# Patient Record
Sex: Female | Born: 1975 | Race: Black or African American | Hispanic: No | Marital: Single | Smoking: Current every day smoker
Health system: Southern US, Community
[De-identification: ages and names within clinical notes are randomized; demographics above are authoritative.]

## PROBLEM LIST (undated history)

## (undated) DIAGNOSIS — F32A Depression, unspecified: Secondary | ICD-10-CM

## (undated) DIAGNOSIS — F419 Anxiety disorder, unspecified: Secondary | ICD-10-CM

## (undated) DIAGNOSIS — F329 Major depressive disorder, single episode, unspecified: Secondary | ICD-10-CM

## (undated) HISTORY — DX: Depression, unspecified: F32.A

## (undated) HISTORY — DX: Anxiety disorder, unspecified: F41.9

## (undated) HISTORY — DX: Major depressive disorder, single episode, unspecified: F32.9

## (undated) HISTORY — PX: TUBAL LIGATION: SHX77

---

## 1998-09-01 ENCOUNTER — Emergency Department (HOSPITAL_COMMUNITY): Admission: EM | Admit: 1998-09-01 | Discharge: 1998-09-01 | Payer: Self-pay | Admitting: Emergency Medicine

## 1999-07-17 ENCOUNTER — Encounter: Payer: Self-pay | Admitting: *Deleted

## 1999-07-17 ENCOUNTER — Emergency Department (HOSPITAL_COMMUNITY): Admission: EM | Admit: 1999-07-17 | Discharge: 1999-07-17 | Payer: Self-pay | Admitting: Emergency Medicine

## 2002-01-26 ENCOUNTER — Encounter: Payer: Self-pay | Admitting: *Deleted

## 2002-01-26 ENCOUNTER — Emergency Department (HOSPITAL_COMMUNITY): Admission: EM | Admit: 2002-01-26 | Discharge: 2002-01-26 | Payer: Self-pay | Admitting: *Deleted

## 2008-03-22 ENCOUNTER — Emergency Department (HOSPITAL_COMMUNITY): Admission: EM | Admit: 2008-03-22 | Discharge: 2008-03-22 | Payer: Self-pay | Admitting: Family Medicine

## 2008-11-26 ENCOUNTER — Emergency Department (HOSPITAL_COMMUNITY): Admission: EM | Admit: 2008-11-26 | Discharge: 2008-11-26 | Payer: Self-pay | Admitting: Family Medicine

## 2010-06-01 LAB — POCT URINALYSIS DIP (DEVICE)
Bilirubin Urine: NEGATIVE
Glucose, UA: NEGATIVE mg/dL
Hgb urine dipstick: NEGATIVE
Ketones, ur: NEGATIVE mg/dL
Nitrite: NEGATIVE
Protein, ur: NEGATIVE mg/dL
Specific Gravity, Urine: >=1.03 (ref 1.005–1.030)
Urobilinogen, UA: 1 mg/dL (ref 0.0–1.0)
pH: 6 (ref 5.0–8.0)

## 2010-06-01 LAB — GC/CHLAMYDIA PROBE AMP, GENITAL
Chlamydia, DNA Probe: NEGATIVE
GC Probe Amp, Genital: NEGATIVE

## 2010-06-01 LAB — WET PREP, GENITAL
Trich, Wet Prep: NONE SEEN
WBC, Wet Prep HPF POC: NONE SEEN
Yeast Wet Prep HPF POC: NONE SEEN

## 2010-06-01 LAB — POCT PREGNANCY, URINE: Preg Test, Ur: NEGATIVE

## 2010-11-13 ENCOUNTER — Emergency Department (HOSPITAL_COMMUNITY)
Admission: EM | Admit: 2010-11-13 | Discharge: 2010-11-13 | Disposition: A | Discharge status: Home / Self Care | Payer: Self-pay | Attending: Emergency Medicine | Admitting: Emergency Medicine

## 2010-11-13 DIAGNOSIS — R220 Localized swelling, mass and lump, head: Secondary | ICD-10-CM | POA: Insufficient documentation

## 2010-11-13 DIAGNOSIS — K089 Disorder of teeth and supporting structures, unspecified: Secondary | ICD-10-CM | POA: Insufficient documentation

## 2010-11-13 DIAGNOSIS — K029 Dental caries, unspecified: Secondary | ICD-10-CM | POA: Insufficient documentation

## 2010-11-13 DIAGNOSIS — R221 Localized swelling, mass and lump, neck: Secondary | ICD-10-CM | POA: Insufficient documentation

## 2015-01-28 ENCOUNTER — Emergency Department (INDEPENDENT_AMBULATORY_CARE_PROVIDER_SITE_OTHER)
Admission: EM | Admit: 2015-01-28 | Discharge: 2015-01-28 | Disposition: A | Discharge status: Home / Self Care | Payer: 59 | Source: Home / Self Care | Attending: Family Medicine | Admitting: Family Medicine

## 2015-01-28 ENCOUNTER — Encounter (HOSPITAL_COMMUNITY): Payer: Self-pay | Admitting: Emergency Medicine

## 2015-01-28 ENCOUNTER — Other Ambulatory Visit (HOSPITAL_COMMUNITY)
Admission: RE | Admit: 2015-01-28 | Discharge: 2015-01-28 | Disposition: A | Discharge status: Home / Self Care | Payer: 59 | Source: Ambulatory Visit | Attending: Family Medicine | Admitting: Family Medicine

## 2015-01-28 DIAGNOSIS — R1084 Generalized abdominal pain: Secondary | ICD-10-CM

## 2015-01-28 DIAGNOSIS — K5901 Slow transit constipation: Secondary | ICD-10-CM

## 2015-01-28 DIAGNOSIS — N730 Acute parametritis and pelvic cellulitis: Secondary | ICD-10-CM

## 2015-01-28 DIAGNOSIS — Z113 Encounter for screening for infections with a predominantly sexual mode of transmission: Secondary | ICD-10-CM | POA: Insufficient documentation

## 2015-01-28 DIAGNOSIS — N760 Acute vaginitis: Secondary | ICD-10-CM | POA: Insufficient documentation

## 2015-01-28 DIAGNOSIS — R102 Pelvic and perineal pain unspecified side: Secondary | ICD-10-CM

## 2015-01-28 LAB — POCT URINALYSIS DIP (DEVICE)
BILIRUBIN URINE: NEGATIVE
Glucose, UA: NEGATIVE mg/dL
HGB URINE DIPSTICK: NEGATIVE
KETONES UR: NEGATIVE mg/dL
Leukocytes, UA: NEGATIVE
Nitrite: NEGATIVE
PH: 7 (ref 5.0–8.0)
PROTEIN: NEGATIVE mg/dL
Specific Gravity, Urine: 1.025 (ref 1.005–1.030)
Urobilinogen, UA: 1 mg/dL (ref 0.0–1.0)

## 2015-01-28 LAB — POCT PREGNANCY, URINE: Preg Test, Ur: NEGATIVE

## 2015-01-28 MED ORDER — CEFTRIAXONE SODIUM 250 MG IJ SOLR
250.0000 mg | Freq: Once | INTRAMUSCULAR | Status: AC
  Administered 2015-01-28: 250 mg via INTRAMUSCULAR

## 2015-01-28 MED ORDER — AZITHROMYCIN 250 MG PO TABS
ORAL_TABLET | ORAL | Status: AC
  Filled 2015-01-28: qty 4

## 2015-01-28 MED ORDER — CEFTRIAXONE SODIUM 250 MG IJ SOLR
INTRAMUSCULAR | Status: AC
  Filled 2015-01-28: qty 250

## 2015-01-28 MED ORDER — LIDOCAINE HCL (PF) 1 % IJ SOLN
INTRAMUSCULAR | Status: AC
  Filled 2015-01-28: qty 5

## 2015-01-28 MED ORDER — METRONIDAZOLE 500 MG PO TABS: 500.0000 mg | ORAL_TABLET | Freq: Two times a day (BID) | ORAL | Status: DC

## 2015-01-28 MED ORDER — AZITHROMYCIN 250 MG PO TABS
1000.0000 mg | ORAL_TABLET | Freq: Once | ORAL | Status: AC
  Administered 2015-01-28: 1000 mg via ORAL

## 2015-01-28 NOTE — ED Provider Notes (Signed)
CSN: [id number]     Arrival date & time 01/28/15  1314 History   First MD Initiated Contact with Patient 01/28/15 1448     Chief Complaint  Patient presents with  . Abdominal Pain   (Consider location/radiation/quality/duration/timing/severity/associated sxs/prior Treatment) HPI Comments: 39 year old female complaining of "stomach pain" for a few months. No more specific in terms of length of time of symptoms. She states it was worse yesterday when she had a cramp-like feeling in the right lateral abdomen. Sometimes she has pain that occurs in the epigastrium, across the lower abdomen and in the pelvis. It comes and goes. She describes pain starts from her "butt to her genitals". Denies urinary symptoms. She states she does have a vaginal discharge started yesterday. Her LMP was 01/11/2015. She describes stools in the past day or 2 that had been soft and loose but does not describe diarrhea. She states she does not have bowel movements on a regular basis.   History reviewed. No pertinent past medical history. History reviewed. No pertinent past surgical history. No family history on file. Social History  Substance Use Topics  . Smoking status: Current Every Day Smoker -- 1.00 packs/day    Types: Cigarettes  . Smokeless tobacco: None  . Alcohol Use: Yes   OB History    No data available     Review of Systems  Constitutional: Positive for activity change. Negative for fever and fatigue.  HENT: Negative.   Respiratory: Negative.   Cardiovascular: Negative.   Gastrointestinal: Positive for abdominal pain. Negative for vomiting and blood in stool.  Genitourinary: Positive for vaginal discharge and pelvic pain. Negative for dysuria, urgency, frequency and vaginal bleeding.  Musculoskeletal: Negative.   Skin: Negative.   Neurological: Negative.     Allergies  Review of patient's allergies indicates no known allergies.  Home Medications   Prior to Admission medications    Medication Sig Start Date End Date Taking? Authorizing Provider  metroNIDAZOLE (FLAGYL) 500 MG tablet Take 1 tablet (500 mg total) by mouth 2 (two) times daily. X 7 days 01/28/15   Hayden Rasmussenavid Knute Mazzuca, NP   Meds Ordered and Administered this Visit   Medications  cefTRIAXone (ROCEPHIN) injection 250 mg (not administered)  azithromycin (ZITHROMAX) tablet 1,000 mg (not administered)    BP 126/76 mmHg  Pulse 62  Temp(Src) 99.4 F (37.4 C) (Oral)  Resp 18  SpO2 100%  LMP 01/11/2015 No data found.   Physical Exam  Constitutional: She is oriented to person, place, and time. She appears well-developed and well-nourished. No distress.  Eyes: EOM are normal.  Neck: Normal range of motion. Neck supple.  Cardiovascular: Normal rate, regular rhythm and normal heart sounds.   Pulmonary/Chest: Effort normal. No respiratory distress. She has no wheezes. She has no rales.  Abdominal: Soft. Bowel sounds are normal. She exhibits no mass. There is tenderness. There is no rebound and no guarding.  Mild tenderness to the epigastrium, right lower and mid quadrant and periumbilical area. Much of the abdomen percusses tympanic. Percussion is dull in the lower abdomen. Tenderness to the anterior pelvis.  Genitourinary:  Normal normal external female genitalia Copious amount of thick white vaginal discharge coating the vaginal walls and pulled in the vaginal vault. Cervix is far posterior. Ectocervix with one above the N cyst. Otherwise clear. Bimanual: Moderate to severe CMT and bilateral adnexal tenderness.   Musculoskeletal: She exhibits no edema.  Neurological: She is alert and oriented to person, place, and time. She exhibits normal muscle tone.  Skin: Skin is warm and dry.  Psychiatric: She has a normal mood and affect.  Nursing note and vitals reviewed.   ED Course  Procedures (including critical care time)  Labs Review Labs Reviewed  POCT URINALYSIS DIP (DEVICE)  POCT PREGNANCY, URINE   CERVICOVAGINAL ANCILLARY ONLY   Results for orders placed or performed during the hospital encounter of 01/28/15  POCT urinalysis dip (device)  Result Value Ref Range   Glucose, UA NEGATIVE NEGATIVE mg/dL   Bilirubin Urine NEGATIVE NEGATIVE   Ketones, ur NEGATIVE NEGATIVE mg/dL   Specific Gravity, Urine 1.025 1.005 - 1.030   Hgb urine dipstick NEGATIVE NEGATIVE   pH 7.0 5.0 - 8.0   Protein, ur NEGATIVE NEGATIVE mg/dL   Urobilinogen, UA 1.0 0.0 - 1.0 mg/dL   Nitrite NEGATIVE NEGATIVE   Leukocytes, UA NEGATIVE NEGATIVE  Pregnancy, urine POC  Result Value Ref Range   Preg Test, Ur NEGATIVE NEGATIVE     Imaging Review No results found.   Visual Acuity Review  Right Eye Distance:   Left Eye Distance:   Bilateral Distance:    Right Eye Near:   Left Eye Near:    Bilateral Near:         MDM   1. Generalized abdominal pain   2. PID (acute pelvic inflammatory disease)   3. Pelvic pain in female   4. Slow transit constipation    likely also has bacterial vaginosis. Malodorous discharge. Rocephin 250 mg IM now Azithromycin 1 g by mouth now Flagyl 500 mg twice a day per Rx Cervical cytology pending.    Hayden Rasmussen, NP 01/28/15 [phone]

## 2015-01-28 NOTE — Discharge Instructions (Signed)
Abdominal Pain, Adult Many things can cause abdominal pain. Usually, abdominal pain is not caused by a disease and will improve without treatment. It can often be observed and treated at home. Your health care provider will do a physical exam and possibly order blood tests and X-rays to help determine the seriousness of your pain. However, in many cases, more time must pass before a clear cause of the pain can be found. Before that point, your health care provider may not know if you need more testing or further treatment. HOME CARE INSTRUCTIONS Monitor your abdominal pain for any changes. The following actions may help to alleviate any discomfort you are experiencing:  Only take over-the-counter or prescription medicines as directed by your health care provider.  Do not take laxatives unless directed to do so by your health care provider.  Try a clear liquid diet (broth, tea, or water) as directed by your health care provider. Slowly move to a bland diet as tolerated. SEEK MEDICAL CARE IF:  You have unexplained abdominal pain.  You have abdominal pain associated with nausea or diarrhea.  You have pain when you urinate or have a bowel movement.  You experience abdominal pain that wakes you in the night.  You have abdominal pain that is worsened or improved by eating food.  You have abdominal pain that is worsened with eating fatty foods.  You have a fever. SEEK IMMEDIATE MEDICAL CARE IF:  Your pain does not go away within 2 hours.  You keep throwing up (vomiting).  Your pain is felt only in portions of the abdomen, such as the right side or the left lower portion of the abdomen.  You pass bloody or black tarry stools. MAKE SURE YOU:  Understand these instructions.  Will watch your condition.  Will get help right away if you are not doing well or get worse.   This information is not intended to replace advice given to you by your health care provider. Make sure you discuss  any questions you have with your health care provider.   Document Released: 11/22/2004 Document Revised: 11/03/2014 Document Reviewed: 10/22/2012 Elsevier Interactive Patient Education 2016 [employer].  Constipation, Adult MiraLAX as directed. It started out using 1 Full for 6-8 ounces of liquid and repeat in 2-3 hours. If results are not adequate may repeat the following day. Constipation is when a person:  Poops (has a bowel movement) less than 3 times a week.  Has a hard time pooping.  Has poop that is dry, hard, or bigger than normal. HOME CARE   Eat foods with a lot of fiber in them. This includes fruits, vegetables, beans, and whole grains such as brown rice.  Avoid fatty foods and foods with a lot of sugar. This includes french fries, hamburgers, cookies, candy, and soda.  If you are not getting enough fiber from food, take products with added fiber in them (supplements).  Drink enough fluid to keep your pee (urine) clear or pale yellow.  Exercise on a regular basis, or as told by your doctor.  Go to the restroom when you feel like you need to poop. Do not hold it.  Only take medicine as told by your doctor. Do not take medicines that help you poop (laxatives) without talking to your doctor first. GET HELP RIGHT AWAY IF:   You have bright red blood in your poop (stool).  Your constipation lasts more than 4 days or gets worse.  You have belly (abdominal) or butt (  rectal) pain.  You have thin poop (as thin as a pencil).  You lose weight, and it cannot be explained. MAKE SURE YOU:   Understand these instructions.  Will watch your condition.  Will get help right away if you are not doing well or get worse.   This information is not intended to replace advice given to you by your health care provider. Make sure you discuss any questions you have with your health care provider.   Document Released: 08/01/2007 Document Revised: 03/05/2014 Document Reviewed:  11/24/2012 Elsevier Interactive Patient Education 2016 [employer] Inc[employer]  Pelvic Inflammatory Disease Pelvic inflammatory disease (PID) refers to an infection in some or all of the female organs. The infection can be in the uterus, ovaries, fallopian tubes, or the surrounding tissues in the pelvis. PID can cause abdominal or pelvic pain that comes on suddenly (acute pelvic pain). PID is a serious infection because it can lead to lasting (chronic) pelvic pain or the inability to have children (infertility). CAUSES This condition is most often caused by an infection that is spread during sexual contact. However, the infection can also be caused by the normal bacteria that are found in the vaginal tissues if these bacteria travel upward into the reproductive organs. PID can also occur following:  The birth of a baby.  A miscarriage.  An abortion.  Major pelvic surgery.  The use of an intrauterine device (IUD).  A sexual assault. RISK FACTORS This condition is more likely to develop in women who:  Are younger than 41 years of age.  Are sexually active at [facility] age.  Use nonbarrier contraception.  Have multiple sexual partners.  Have sex with someone who has symptoms of an STD (sexually transmitted disease).  Use oral contraception. At times, certain behaviors can also increase the possibility of getting PID, such as:  Using a vaginal douche.  Having an IUD in place. SYMPTOMS Symptoms of this condition include:  Abdominal or pelvic pain.  Fever.  Chills.  Abnormal vaginal discharge.  Abnormal uterine bleeding.  Unusual pain shortly after the end of a menstrual period.  Painful urination.  Pain with sexual intercourse.  Nausea and vomiting. DIAGNOSIS To diagnose this condition, your health care provider will do a physical exam and take your medical history. A pelvic exam typically reveals great tenderness in the uterus and the surrounding pelvic tissues. You may  also have tests, such as:  Lab tests, including a pregnancy test, blood tests, and urine test.  Culture tests of the vagina and cervix to check for an STD.  Ultrasound.  A laparoscopic procedure to look inside the pelvis.  Examining vaginal secretions under a microscope. TREATMENT Treatment for this condition may involve one or more approaches.  Antibiotic medicines may be prescribed to be taken by mouth.  Sexual partners may need to be treated if the infection is caused by an STD.  For more severe cases, hospitalization may be needed to give antibiotics directly into a vein through an IV tube.  Surgery may be needed if other treatments do not help, but this is rare. It may take weeks until you are completely well. If you are diagnosed with PID, you should also be checked for human immunodeficiency virus (HIV). Your health care provider may test you for infection again 3 months after treatment. You should not have unprotected sex. HOME CARE INSTRUCTIONS  Take over-the-counter and prescription medicines only as told by your health care provider.  If you were prescribed an antibiotic medicine,  take it as told by your health care provider. Do not stop taking the antibiotic even if you start to feel better.  Do not have sexual intercourse until treatment is completed or as told by your health care provider. If PID is confirmed, your recent sexual partners will need treatment, especially if you had unprotected sex.  Keep all follow-up visits as told by your health care provider. This is important. SEEK MEDICAL CARE IF:  You have increased or abnormal vaginal discharge.  Your pain does not improve.  You vomit.  You have a fever.  You cannot tolerate your medicines.  Your partner has an STD.  You have pain when you urinate. SEEK IMMEDIATE MEDICAL CARE IF:  You have increased abdominal or pelvic pain.  You have chills.  Your symptoms are not better in 72 hours even with  treatment.   This information is not intended to replace advice given to you by your health care provider. Make sure you discuss any questions you have with your health care provider.   Document Released: 02/12/2005 Document Revised: 11/03/2014 Document Reviewed: 03/22/2014 Elsevier Interactive Patient Education 2016 Elsevier Inc.  Pelvic Pain, Female Female pelvic pain can be caused by many different things and start from a variety of places. Pelvic pain refers to pain that is located in the lower half of the abdomen and between your hips. The pain may occur over a short period of time (acute) or may be reoccurring (chronic). The cause of pelvic pain may be related to disorders affecting the female reproductive organs (gynecologic), but it may also be related to the bladder, kidney stones, an intestinal complication, or muscle or skeletal problems. Getting help right away for pelvic pain is important, especially if there has been severe, sharp, or a sudden onset of unusual pain. It is also important to get help right away because some types of pelvic pain can be life threatening.  CAUSES  Below are only some of the causes of pelvic pain. The causes of pelvic pain can be in one of several categories.   Gynecologic.  Pelvic inflammatory disease.  Sexually transmitted infection.  Ovarian cyst or a twisted ovarian ligament (ovarian torsion).  Uterine lining that grows outside the uterus (endometriosis).  Fibroids, cysts, or tumors.  Ovulation.  Pregnancy.  Pregnancy that occurs outside the uterus (ectopic pregnancy).  Miscarriage.  Labor.  Abruption of the placenta or ruptured uterus.  Infection.  Uterine infection (endometritis).  Bladder infection.  Diverticulitis.  Miscarriage related to a uterine infection (septic abortion).  Bladder.  Inflammation of the bladder (cystitis).  Kidney  stone(s).  Gastrointestinal.  Constipation.  Diverticulitis.  Neurologic.  Trauma.  Feeling pelvic pain because of mental or emotional causes (psychosomatic).  Cancers of the bowel or pelvis. EVALUATION  Your caregiver will want to take a careful history of your concerns. This includes recent changes in your health, a careful gynecologic history of your periods (menses), and a sexual history. Obtaining your family history and medical history is also important. Your caregiver may suggest a pelvic exam. A pelvic exam will help identify the location and severity of the pain. It also helps in the evaluation of which organ system may be involved. In order to identify the cause of the pelvic pain and be properly treated, your caregiver may order tests. These tests may include:   A pregnancy test.  Pelvic ultrasonography.  An X-ray exam of the abdomen.  A urinalysis or evaluation of vaginal discharge.  Blood tests. HOME  CARE INSTRUCTIONS   Only take over-the-counter or prescription medicines for pain, discomfort, or fever as directed by your caregiver.   Rest as directed by your caregiver.   Eat a balanced diet.   Drink enough fluids to make your urine clear or pale yellow, or as directed.   Avoid sexual intercourse if it causes pain.   Apply warm or cold compresses to the lower abdomen depending on which one helps the pain.   Avoid stressful situations.   Keep a journal of your pelvic pain. Write down when it started, where the pain is located, and if there are things that seem to be associated with the pain, such as food or your menstrual cycle.  Follow up with your caregiver as directed.  SEEK MEDICAL CARE IF:  Your medicine does not help your pain.  You have abnormal vaginal discharge. SEEK IMMEDIATE MEDICAL CARE IF:   You have heavy bleeding from the vagina.   Your pelvic pain increases.   You feel light-headed or faint.   You have chills.   You  have pain with urination or blood in your urine.   You have uncontrolled diarrhea or vomiting.   You have a fever or persistent symptoms for more than 3 days.  You have a fever and your symptoms suddenly get worse.   You are being physically or sexually abused.   This information is not intended to replace advice given to you by your health care provider. Make sure you discuss any questions you have with your health care provider.   Document Released: 01/10/2004 Document Revised: 11/03/2014 Document Reviewed: 06/04/2011 Elsevier Interactive Patient Education 2016 [employer].  Sexually Transmitted Disease A sexually transmitted disease (STD) is a disease or infection that may be passed (transmitted) from person to person, usually during sexual activity. This may happen by way of saliva, semen, blood, vaginal mucus, or urine. Common STDs include:  Gonorrhea.  Chlamydia.  Syphilis.  HIV and AIDS.  Genital herpes.  Hepatitis B and C.  Trichomonas.  Human papillomavirus (HPV).  Pubic lice.  Scabies.  Mites.  Bacterial vaginosis. WHAT ARE CAUSES OF STDs? An STD may be caused by bacteria, a virus, or parasites. STDs are often transmitted during sexual activity if one person is infected. However, they may also be transmitted through nonsexual means. STDs may be transmitted after:   Sexual intercourse with an infected person.  Sharing sex toys with an infected person.  Sharing needles with an infected person or using unclean piercing or tattoo needles.  Having intimate contact with the genitals, mouth, or rectal areas of an infected person.  Exposure to infected fluids during birth. WHAT ARE THE SIGNS AND SYMPTOMS OF STDs? Different STDs have different symptoms. Some people may not have any symptoms. If symptoms are present, they may include:  Painful or bloody urination.  Pain in the pelvis, abdomen, vagina, anus, throat, or eyes.  A skin rash, itching, or  irritation.  Growths, ulcerations, blisters, or sores in the genital and anal areas.  Abnormal vaginal discharge with or without bad odor.  Penile discharge in men.  Fever.  Pain or bleeding during sexual intercourse.  Swollen glands in the groin area.  Yellow skin and eyes (jaundice). This is seen with hepatitis.  Swollen testicles.  Infertility.  Sores and blisters in the mouth. HOW ARE STDs DIAGNOSED? To make a diagnosis, your health care provider may:  Take a medical history.  Perform a physical exam.  Take a sample of any  discharge to examine.  Swab the throat, cervix, opening to the penis, rectum, or vagina for testing.  Test a sample of your first morning urine.  Perform blood tests.  Perform a Pap test, if this applies.  Perform a colposcopy.  Perform a laparoscopy. HOW ARE STDs TREATED? Treatment depends on the STD. Some STDs may be treated but not cured.  Chlamydia, gonorrhea, trichomonas, and syphilis can be cured with antibiotic medicine.  Genital herpes, hepatitis, and HIV can be treated, but not cured, with prescribed medicines. The medicines lessen symptoms.  Genital warts from HPV can be treated with medicine or by freezing, burning (electrocautery), or surgery. Warts may come back.  HPV cannot be cured with medicine or surgery. However, abnormal areas may be removed from the cervix, vagina, or vulva.  If your diagnosis is confirmed, your recent sexual partners need treatment. This is true even if they are symptom-free or have a negative culture or evaluation. They should not have sex until their health care providers say it is okay.  Your health care provider may test you for infection again 3 months after treatment. HOW CAN I REDUCE MY RISK OF GETTING AN STD? Take these steps to reduce your risk of getting an STD:  Use latex condoms, dental dams, and water-soluble lubricants during sexual activity. Do not use petroleum jelly or  oils.  Avoid having multiple sex partners.  Do not have sex with someone who has other sex partners  Do not have sex with anyone you do not know or who is at high risk for an STD.  Avoid risky sex practices that can break your skin.  Do not have sex if you have open sores on your mouth or skin.  Avoid drinking too much alcohol or taking illegal drugs. Alcohol and drugs can affect your judgment and put you in a vulnerable position.  Avoid engaging in oral and anal sex acts.  Get vaccinated for HPV and hepatitis. If you have not received these vaccines in the past, talk to your health care provider about whether one or both might be right for you.  If you are at risk of being infected with HIV, it is recommended that you take a prescription medicine daily to prevent HIV infection. This is called pre-exposure prophylaxis (PrEP). You are considered at risk if:  You are a man who has sex with other men (MSM).  You are a heterosexual man or woman and are sexually active with more than one partner.  You take drugs by injection.  You are sexually active with a partner who has HIV.  Talk with your health care provider about whether you are at high risk of being infected with HIV. If you choose to begin PrEP, you should first be tested for HIV. You should then be tested every 3 months for as long as you are taking PrEP. WHAT SHOULD I DO IF I THINK I HAVE AN STD?  See your health care provider.  Tell your sexual partner(s). They should be tested and treated for any STDs.  Do not have sex until your health care provider says it is okay. WHEN SHOULD I GET IMMEDIATE MEDICAL CARE? Contact your health care provider right away if:   You have severe abdominal pain.  You are a man and notice swelling or pain in your testicles.  You are a woman and notice swelling or pain in your vagina.   This information is not intended to replace advice given to you by  your health care provider. Make sure  you discuss any questions you have with your health care provider.   Document Released: 05/05/2002 Document Revised: 03/05/2014 Document Reviewed: 09/02/2012 Elsevier Interactive Patient Education [employer]

## 2015-01-28 NOTE — ED Notes (Signed)
C/o intermittent abd pain onset yest associated w/nausea and diarrhea Pain increases w/activity and pressure Denies urinary sx A&O x4... No acute distress.

## 2015-01-31 LAB — CERVICOVAGINAL ANCILLARY ONLY
CHLAMYDIA, DNA PROBE: NEGATIVE
NEISSERIA GONORRHEA: NEGATIVE
Wet Prep (BD Affirm): POSITIVE — AB

## 2015-01-31 NOTE — ED Notes (Signed)
Voice mailbox for this patient has not yet been set up , so unable to leave a message

## 2015-02-01 NOTE — ED Notes (Signed)
Unable to leave message. Voice mail box is full.

## 2015-02-01 NOTE — ED Notes (Signed)
Voice mailbox is not set up, unable to leave a message for patient to call us

## 2015-02-03 NOTE — ED Notes (Signed)
Unable to leave message in full voice mail box

## 2015-02-18 ENCOUNTER — Other Ambulatory Visit (HOSPITAL_COMMUNITY)
Admission: RE | Admit: 2015-02-18 | Discharge: 2015-02-18 | Disposition: A | Discharge status: Home / Self Care | Payer: 59 | Source: Ambulatory Visit | Attending: Obstetrics & Gynecology | Admitting: Obstetrics & Gynecology

## 2015-02-18 ENCOUNTER — Other Ambulatory Visit: Payer: Self-pay | Admitting: Obstetrics & Gynecology

## 2015-02-18 DIAGNOSIS — Z1151 Encounter for screening for human papillomavirus (HPV): Secondary | ICD-10-CM | POA: Diagnosis not present

## 2015-02-18 DIAGNOSIS — Z113 Encounter for screening for infections with a predominantly sexual mode of transmission: Secondary | ICD-10-CM | POA: Insufficient documentation

## 2015-02-18 DIAGNOSIS — Z01419 Encounter for gynecological examination (general) (routine) without abnormal findings: Secondary | ICD-10-CM | POA: Diagnosis present

## 2015-02-23 LAB — CYTOLOGY - PAP

## 2016-05-10 ENCOUNTER — Encounter (HOSPITAL_COMMUNITY): Payer: Self-pay | Admitting: Emergency Medicine

## 2016-05-10 ENCOUNTER — Ambulatory Visit (HOSPITAL_COMMUNITY)
Admission: EM | Admit: 2016-05-10 | Discharge: 2016-05-10 | Disposition: A | Discharge status: Home / Self Care | Payer: 59 | Attending: Family Medicine | Admitting: Family Medicine

## 2016-05-10 DIAGNOSIS — F3289 Other specified depressive episodes: Secondary | ICD-10-CM

## 2016-05-10 DIAGNOSIS — K5900 Constipation, unspecified: Secondary | ICD-10-CM

## 2016-05-10 DIAGNOSIS — F419 Anxiety disorder, unspecified: Secondary | ICD-10-CM

## 2016-05-10 MED ORDER — PROPRANOLOL HCL 10 MG PO TABS: 10.0000 mg | ORAL_TABLET | Freq: Two times a day (BID) | ORAL | 0 refills | Status: DC

## 2016-05-10 MED ORDER — POLYETHYLENE GLYCOL 3350 17 G PO PACK: 17.0000 g | PACK | Freq: Two times a day (BID) | ORAL | 0 refills | Status: DC

## 2016-05-10 NOTE — ED Triage Notes (Signed)
Pt reports feelings of abdominal pain, or a knot that she reports moves up to her chest, fatigue, moments of crying and staying in bed for the last week.  She says "I just feel different"

## 2016-05-10 NOTE — ED Provider Notes (Signed)
CSN: [id number]     Arrival date & time 05/10/16  1753 History   First MD Initiated Contact with Patient 05/10/16 1837     Chief Complaint  Patient presents with  . Abdominal Pain  . Fatigue   (Consider location/radiation/quality/duration/timing/severity/associated sxs/prior Treatment) Patient c/o depressed mood and having frequent crying spells, fatigue, anxiety and panic and feeling like her heart is racing.  She has been unable to get out of bed in the am.  She  Denies any suicidal or homicidal ideation.  She c/o constipation and abdominal pain.     The history is provided by the patient.  Abdominal Pain  Pain location:  Generalized Pain quality: aching and bloating   Pain radiates to:  Does not radiate Pain severity:  No pain Onset quality:  Sudden Duration:  1 week Timing:  Constant Progression:  Worsening Chronicity:  New Relieved by:  Nothing Worsened by:  Nothing Ineffective treatments:  None tried   History reviewed. No pertinent past medical history. History reviewed. No pertinent surgical history. History reviewed. No pertinent family history. Social History  Substance Use Topics  . Smoking status: Current Every Day Smoker    Packs/day: 0.50    Types: Cigarettes  . Smokeless tobacco: Never Used  . Alcohol use Yes   OB History    No data available     Review of Systems  Constitutional: Negative.   HENT: Negative.   Eyes: Negative.   Respiratory: Negative.   Cardiovascular: Negative.   Gastrointestinal: Positive for abdominal pain.  Endocrine: Negative.   Genitourinary: Negative.   Musculoskeletal: Negative.   Allergic/Immunologic: Negative.   Neurological: Negative.   Hematological: Negative.   Psychiatric/Behavioral: Negative.     Allergies  Patient has no known allergies.  Home Medications   Prior to Admission medications   Medication Sig Start Date End Date Taking? Authorizing Provider  metroNIDAZOLE (FLAGYL) 500 MG tablet Take 1 tablet  (500 mg total) by mouth 2 (two) times daily. X 7 days 01/28/15   Hayden Rasmussenavid Mabe, NP  polyethylene glycol [facility](MIRALAX) packet Take 17 g by mouth 2 (two) times daily. 05/10/16   Deatra CanterWilliam J Oxford, FNP  propranolol (INDERAL) 10 MG tablet Take 1 tablet (10 mg total) by mouth 2 (two) times daily. 05/10/16   Deatra CanterWilliam J Oxford, FNP   Meds Ordered and Administered this Visit  Medications - No data to display  BP 118/84 (BP Location: Right Arm)   Pulse 70   SpO2 98%  No data found.   Physical Exam  Constitutional: She appears well-developed and well-nourished.  HENT:  Head: Normocephalic and atraumatic.  Eyes: Conjunctivae and EOM are normal. Pupils are equal, round, and reactive to light.  Neck: Normal range of motion. Neck supple.  Cardiovascular: Normal rate, regular rhythm and normal heart sounds.   Pulmonary/Chest: Effort normal and breath sounds normal.  Abdominal: Soft. Bowel sounds are normal.  Nursing note and vitals reviewed.   Urgent Care Course     Procedures (including critical care time)  Labs Review Labs Reviewed - No data to display  Imaging Review No results found.   Visual Acuity Review  Right Eye Distance:   Left Eye Distance:   Bilateral Distance:    Right Eye Near:   Left Eye Near:    Bilateral Near:         MDM   1. Other depression   2. Anxiety   3. Constipation, unspecified constipation type    Propanolol  10 mg one  Po bid #14 Miralax bid #14  Referral to [facility]     Deatra Canter, [place] 05/10/16 1901

## 2017-05-19 ENCOUNTER — Other Ambulatory Visit: Payer: Self-pay

## 2017-05-19 ENCOUNTER — Encounter (HOSPITAL_COMMUNITY): Payer: Self-pay | Admitting: Emergency Medicine

## 2017-05-19 ENCOUNTER — Ambulatory Visit (HOSPITAL_COMMUNITY)
Admission: EM | Admit: 2017-05-19 | Discharge: 2017-05-19 | Disposition: A | Discharge status: Home / Self Care | Payer: Federal, State, Local not specified - PPO | Attending: Internal Medicine | Admitting: Internal Medicine

## 2017-05-19 DIAGNOSIS — J019 Acute sinusitis, unspecified: Secondary | ICD-10-CM | POA: Diagnosis not present

## 2017-05-19 MED ORDER — AMOXICILLIN 875 MG PO TABS: 875.0000 mg | ORAL_TABLET | Freq: Two times a day (BID) | ORAL | 0 refills | Status: AC

## 2017-05-19 MED ORDER — ONDANSETRON HCL 4 MG PO TABS: 4.0000 mg | ORAL_TABLET | Freq: Three times a day (TID) | ORAL | 0 refills | Status: DC | PRN

## 2017-05-19 NOTE — Discharge Instructions (Signed)
Push fluids to ensure adequate hydration and keep secretions thin.  Tylenol and/or ibuprofen as needed for pain or fevers.  Complete course of antibiotics.  Zofran as needed for nausea or vomiting. If symptoms worsen or do not improve in the next week to return to be seen or to follow up with your PCP.

## 2017-05-19 NOTE — ED Provider Notes (Signed)
[facility]    CSN: [id number] Arrival date & time: 05/19/17  1807     History   Chief Complaint Chief Complaint  Patient presents with  . URI    HPI Breanna Wilson is a 42 y.o. female.   Breanna Wilson presents with complaints of 1 week of symptoms which have not been improving. Congestion which causes cough. Body aches. Nausea. Today vomited x1. Eating and drinking. Stools have been more loose and occasionally has cramping. Has been taking mucinex which has been helpful. Left facial pressure and pain to left ear. Increased congestion and feeling of wheezing at night. States she has had ill coworkers. No rash. Without contributing medical history.    ROS per HPI.      History reviewed. No pertinent past medical history.  There are no active problems to display for this patient.   Past Surgical History:  Procedure Laterality Date  . TUBAL LIGATION      OB History   None      Home Medications    Prior to Admission medications   Medication Sig Start Date End Date Taking? Authorizing Provider  amoxicillin (AMOXIL) 875 MG tablet Take 1 tablet (875 mg total) by mouth 2 (two) times daily for 10 days. 05/19/17 05/29/17  Georgetta Haber, NP  ondansetron (ZOFRAN) 4 MG tablet Take 1 tablet (4 mg total) by mouth every 8 (eight) hours as needed for nausea or vomiting. 05/19/17   Georgetta Haber, NP  polyethylene glycol (MIRALAX) packet Take 17 g by mouth 2 (two) times daily. 05/10/16   Deatra Canter, FNP    Family History Family History  Problem Relation Age of Onset  . Hypertension Mother     Social History Social History   Tobacco Use  . Smoking status: Current Every Day Smoker    Packs/day: 0.50    Types: Cigarettes  . Smokeless tobacco: Never Used  Substance Use Topics  . Alcohol use: Yes  . Drug use: No     Allergies   Patient has no known allergies.   Review of Systems Review of Systems   Physical Exam Triage Vital Signs ED Triage  Vitals  Enc Vitals Group     BP 05/19/17 1841 117/79     Pulse Rate 05/19/17 1841 71     Resp 05/19/17 1841 18     Temp 05/19/17 1841 99 F (37.2 C)     Temp Source 05/19/17 1841 Oral     SpO2 05/19/17 1841 100 %     Weight --      Height --      Head Circumference --      Peak Flow --      Pain Score 05/19/17 1843 3     Pain Loc --      Pain Edu? --      Excl. in GC? --    No data found.  Updated Vital Signs BP 117/79 (BP Location: Left Arm)   Pulse 71   Temp 99 F (37.2 C) (Oral)   Resp 18   SpO2 100%   Visual Acuity Right Eye Distance:   Left Eye Distance:   Bilateral Distance:    Right Eye Near:   Left Eye Near:    Bilateral Near:     Physical Exam  Constitutional: She is oriented to person, place, and time. She appears well-developed and well-nourished. No distress.  HENT:  Head: Normocephalic and atraumatic.  Right Ear: Tympanic membrane, external ear  and ear canal normal.  Left Ear: Tympanic membrane, external ear and ear canal normal.  Nose: Rhinorrhea present. Right sinus exhibits no maxillary sinus tenderness and no frontal sinus tenderness. Left sinus exhibits no maxillary sinus tenderness and no frontal sinus tenderness.  Mouth/Throat: Uvula is midline, oropharynx is clear and moist and mucous membranes are normal. No tonsillar exudate.  Eyes: Pupils are equal, round, and reactive to light. Conjunctivae and EOM are normal.  Cardiovascular: Normal rate, regular rhythm and normal heart sounds.  Pulmonary/Chest: Effort normal and breath sounds normal.  Abdominal: Soft. She exhibits no distension and no mass. There is no tenderness. There is no guarding.  Neurological: She is alert and oriented to person, place, and time.  Skin: Skin is warm and dry.     UC Treatments / Results  Labs (all labs ordered are listed, but only abnormal results are displayed) Labs Reviewed - No data to display  EKG None Radiology No results  found.  Procedures Procedures (including critical care time)  Medications Ordered in UC Medications - No data to display   Initial Impression / Assessment and Plan / UC Course  I have reviewed the triage vital signs and the nursing notes.  Pertinent labs & imaging results that were available during my care of the patient were reviewed by me and considered in my medical decision making (see chart for details).    Symptoms worsening over the past week. Opted to initiate amoxicillin at this time, zofran as needed for nausea/vomiting. Continue with mucinex as needed. Push fluids. Return precautions provided. Patient verbalized understanding and agreeable to plan.    Final Clinical Impressions(s) / UC Diagnoses   Final diagnoses:  Acute non-recurrent sinusitis, unspecified location    ED Discharge Orders        Ordered    amoxicillin (AMOXIL) 875 MG tablet  2 times daily     05/19/17 1903    ondansetron (ZOFRAN) 4 MG tablet  Every 8 hours PRN     05/19/17 1903       Controlled Substance Prescriptions [place] Controlled Substance Registry consulted? Not Applicable   Georgetta HaberBurky, Calum Cormier B, NP 05/19/17 1909

## 2017-05-19 NOTE — ED Triage Notes (Signed)
Symptoms started one week ago.  Patient has had sore throat, cough, congestion, and phlegm.  Patient reports wheezing at night, stuffy nose.  Today patient nauseated and general aches

## 2017-12-22 ENCOUNTER — Ambulatory Visit (HOSPITAL_COMMUNITY)
Admission: EM | Admit: 2017-12-22 | Discharge: 2017-12-22 | Disposition: A | Discharge status: Home / Self Care | Payer: Federal, State, Local not specified - PPO | Attending: Family Medicine | Admitting: Family Medicine

## 2017-12-22 ENCOUNTER — Encounter (HOSPITAL_COMMUNITY): Payer: Self-pay

## 2017-12-22 DIAGNOSIS — R110 Nausea: Secondary | ICD-10-CM

## 2017-12-22 DIAGNOSIS — R42 Dizziness and giddiness: Secondary | ICD-10-CM | POA: Diagnosis not present

## 2017-12-22 DIAGNOSIS — R030 Elevated blood-pressure reading, without diagnosis of hypertension: Secondary | ICD-10-CM | POA: Diagnosis not present

## 2017-12-22 DIAGNOSIS — R0602 Shortness of breath: Secondary | ICD-10-CM | POA: Diagnosis not present

## 2017-12-22 DIAGNOSIS — F17210 Nicotine dependence, cigarettes, uncomplicated: Secondary | ICD-10-CM

## 2017-12-22 NOTE — ED Provider Notes (Signed)
[facility]    CSN: [id number] Arrival date & time: 12/22/17  1726     History   Chief Complaint Chief Complaint  Patient presents with  . Dizziness  . Shortness of Breath  . Nausea    HPI Breanna Wilson is a 42 y.o. female.   42 year old female presents with nausea, dizziness and feeling short of breath that has become more noticeable the past few days. She started with occasional symptoms about 6 weeks ago. Noticed that when she had a day off from work, she would experience these symptoms but seemed to do fine at work. She denies any fever, nasal congestion, sore throat, cough, chest pain, palpitations, vomiting or diarrhea. She does admit to increasing her coffee intake and just started back drinking Mt Dew each day. She also smokes about 1 pack of cigarettes every other day. She admits to recent marijuana use within the past few days and cocaine use about 1 week ago. No recent alcohol use this week. No known history of cardiac or heart problems. No hyperlipidemia. No history of HTN. No family history of heart problems. Does have history of anxiety and was given Propanolol in the past to help with symptoms. She did not use this medication. No other chronic health issues. Takes no daily medication.    The history is provided by the patient.    History reviewed. No pertinent past medical history.  There are no active problems to display for this patient.   Past Surgical History:  Procedure Laterality Date  . TUBAL LIGATION      OB History   None      Home Medications    Prior to Admission medications   Not on File    Family History Family History  Problem Relation Age of Onset  . Hypertension Mother     Social History Social History   Tobacco Use  . Smoking status: Current Every Day Smoker    Packs/day: 0.50    Types: Cigarettes  . Smokeless tobacco: Never Used  Substance Use Topics  . Alcohol use: Yes  . Drug use: No     Allergies    Patient has no known allergies.   Review of Systems Review of Systems  Constitutional: Positive for fatigue. Negative for activity change, appetite change, chills, diaphoresis and fever.  HENT: Negative for congestion, ear discharge, ear pain, facial swelling, hearing loss, nosebleeds, postnasal drip, sore throat and trouble swallowing.   Eyes: Negative for photophobia, discharge, itching and visual disturbance.  Respiratory: Positive for shortness of breath. Negative for cough, chest tightness and wheezing.   Cardiovascular: Negative for chest pain and palpitations.  Gastrointestinal: Positive for nausea. Negative for abdominal pain, blood in stool, constipation, diarrhea and vomiting.  Genitourinary: Negative for decreased urine volume, difficulty urinating, frequency and hematuria.  Musculoskeletal: Negative for arthralgias, back pain, myalgias, neck pain and neck stiffness.  Skin: Negative for color change, rash and wound.  Neurological: Positive for dizziness and light-headedness. Negative for tremors, seizures, syncope, facial asymmetry, speech difficulty, weakness, numbness and headaches.  Hematological: Negative for adenopathy. Does not bruise/bleed easily.  Psychiatric/Behavioral: The patient is nervous/anxious.     Physical Exam Triage Vital Signs ED Triage Vitals  Enc Vitals Group     BP 12/22/17 1803 (!) 130/115     Pulse Rate 12/22/17 1803 67     Resp 12/22/17 1803 20     Temp 12/22/17 1803 97.8 F (36.6 C)     Temp  Source 12/22/17 1803 Oral     SpO2 12/22/17 1803 100 %     Weight --      Height --      Head Circumference --      Peak Flow --      Pain Score 12/22/17 1804 6     Pain Loc --      Pain Edu? --      Excl. in GC? --    No data found.  Updated Vital Signs BP 116/74   Pulse 67   Temp 97.8 F (36.6 C) (Oral)   Resp 20   SpO2 100%   Visual Acuity Right Eye Distance:   Left Eye Distance:   Bilateral Distance:    Right Eye Near:   Left Eye  Near:    Bilateral Near:     Physical Exam  Constitutional: She is oriented to person, place, and time. She appears well-developed and well-nourished. She appears lethargic. She is cooperative. She does not appear ill. No distress.  Patient sitting comfortably on exam table in no acute distress. Blood pressure rechecked at end of exam and in normal range (116/74).    HENT:  Head: Normocephalic and atraumatic.  Right Ear: Hearing, tympanic membrane, external ear and ear canal normal.  Left Ear: Hearing, tympanic membrane, external ear and ear canal normal.  Nose: Nose normal.  Mouth/Throat: Uvula is midline, oropharynx is clear and moist and mucous membranes are normal.  Eyes: Pupils are equal, round, and reactive to light. EOM and lids are normal. Right eye exhibits chemosis. Left eye exhibits chemosis. Right conjunctiva is injected. Left conjunctiva is injected.  Blood-shot eyes bilaterally  Neck: Normal range of motion. Neck supple.  Cardiovascular: Normal rate, regular rhythm, normal heart sounds and normal pulses.  No murmur heard. Pulmonary/Chest: Effort normal and breath sounds normal. No stridor. No respiratory distress. She has no decreased breath sounds. She has no wheezes. She has no rhonchi. She has no rales.  Musculoskeletal: Normal range of motion.  Lymphadenopathy:    She has no cervical adenopathy.  Neurological: She is oriented to person, place, and time. She has normal strength and normal reflexes. She appears lethargic. No cranial nerve deficit or sensory deficit. She displays a negative Romberg sign. Gait abnormal.  Patient has difficulty with toe-heel walking. Unable to walk in a straight line.    Skin: Skin is warm, dry and intact. Capillary refill takes less than 2 seconds. No rash noted.  Psychiatric: Her speech is normal. Judgment and thought content normal. Her mood appears anxious. She is slowed.  Vitals reviewed.    UC Treatments / Results  Labs (all labs  ordered are listed, but only abnormal results are displayed) Labs Reviewed - No data to display  EKG None  Radiology No results found.  Procedures ED EKG Date/Time: 12/22/2017 7:03 PM Performed by: Sudie Grumbling, NP Authorized by: Sudie Grumbling, NP   ECG reviewed by ED Physician in the absence of a cardiologist: yes   Previous ECG:    Previous ECG:  Unavailable (no previous ECG) Interpretation:    Interpretation: normal   Rate:    ECG rate:  58   ECG rate assessment: normal   Rhythm:    Rhythm: sinus rhythm   Ectopy:    Ectopy: none   QRS:    QRS axis:  Normal Conduction:    Conduction: normal   ST segments:    ST segments:  Normal T waves:  T waves: normal   Comments:     No distinct abnormalities detected on ECG.    (including critical care time)  Medications Ordered in UC Medications - No data to display  Initial Impression / Assessment and Plan / UC Course  I have reviewed the triage vital signs and the nursing notes.  Pertinent labs & imaging results that were available during my care of the patient were reviewed by me and considered in my medical decision making (see chart for details).    Reviewed with patient that blood pressure is now normal. No abnormalities detected on ECG. Patient feels better with less dizziness now. Discussed that her symptoms may be caused by increased caffeine, nicotine, cocaine and/or marijuana use. Anxiety may also be a factor. Recommend continue to monitor symptoms. Strongly encouraged to d/c illicit drug use and limit caffeine. No medication prescribed today. Note written for work for today. Continue to monitor blood pressure 2-3 times a week and encouraged healthier eating habits. Patient in no distress currently so no need for immediate care. Patient to call her PCP tomorrow AM to schedule appointment for further evaluation.   Final Clinical Impressions(s) / UC Diagnoses   Final diagnoses:  Dizziness  Nausea    Shortness of breath   Note- Epic system crashed during writing of this note. A temporary note was obtained and various parts of the history, ROS and physical along with treatment plan were copied from the temporary note. This note should now be accurate and complete to the best of my knowledge.    Discharge Instructions     Recommend continue to monitor symptoms. Recommend decrease caffeine intake and avoid illicit drug use. Also encouraged to decrease smoking. Continue to monitor blood pressure 2 to 3 times a week. Call your PCP tomorrow AM to schedule appointment for further work-up.     ED Prescriptions    None     Controlled Substance Prescriptions [place] Controlled Substance Registry consulted? Yes, I have consulted the [place] Controlled Substances Registry for this patient to determine if she is receiving any narcotic or controlled medication that she is not disclosing. No active Rx in the past 12 months. Last Rx was 04/2016 for hydrocodone.    Sudie Grumbling, NP 12/23/17 1329

## 2017-12-22 NOTE — Discharge Instructions (Addendum)
Recommend continue to monitor symptoms. Recommend decrease caffeine intake and avoid illicit drug use. Also encouraged to decrease smoking. Continue to monitor blood pressure 2 to 3 times a week. Call your PCP tomorrow AM to schedule appointment for further work-up.

## 2017-12-22 NOTE — ED Triage Notes (Signed)
Pt presents with elevated blood pressure, nausea, dizziness and shortness of breath.

## 2017-12-25 DIAGNOSIS — R42 Dizziness and giddiness: Secondary | ICD-10-CM | POA: Diagnosis not present

## 2018-01-04 ENCOUNTER — Encounter: Payer: Self-pay | Admitting: Sports Medicine

## 2018-01-04 ENCOUNTER — Ambulatory Visit (INDEPENDENT_AMBULATORY_CARE_PROVIDER_SITE_OTHER): Payer: Federal, State, Local not specified - PPO

## 2018-01-04 ENCOUNTER — Ambulatory Visit: Payer: Federal, State, Local not specified - PPO | Admitting: Sports Medicine

## 2018-01-04 VITALS — BP 120/82 | HR 62 | Resp 15 | Ht 61.0 in | Wt 135.0 lb

## 2018-01-04 DIAGNOSIS — Q828 Other specified congenital malformations of skin: Secondary | ICD-10-CM

## 2018-01-04 DIAGNOSIS — M2041 Other hammer toe(s) (acquired), right foot: Secondary | ICD-10-CM

## 2018-01-04 DIAGNOSIS — M79671 Pain in right foot: Secondary | ICD-10-CM

## 2018-01-04 DIAGNOSIS — M79672 Pain in left foot: Secondary | ICD-10-CM

## 2018-01-04 DIAGNOSIS — M722 Plantar fascial fibromatosis: Secondary | ICD-10-CM | POA: Diagnosis not present

## 2018-01-04 DIAGNOSIS — B359 Dermatophytosis, unspecified: Secondary | ICD-10-CM | POA: Diagnosis not present

## 2018-01-04 DIAGNOSIS — M2042 Other hammer toe(s) (acquired), left foot: Secondary | ICD-10-CM

## 2018-01-04 MED ORDER — CLOTRIMAZOLE 1 % EX SOLN: 1.0000 "application " | Freq: Two times a day (BID) | CUTANEOUS | 5 refills | Status: DC

## 2018-01-04 MED ORDER — NAFTIFINE HCL 1 % EX CREA: TOPICAL_CREAM | Freq: Every day | CUTANEOUS | 0 refills | Status: DC

## 2018-01-04 NOTE — Progress Notes (Signed)
   Subjective:    Patient ID: Breanna Wilson, female    DOB: [birth year], 42 y.o.   MRN: [id number]  HPI    Review of Systems  All other systems reviewed and are negative.      Objective:   Physical Exam        Assessment & Plan:

## 2018-01-04 NOTE — Progress Notes (Signed)
Subjective: Breanna Wilson is a 42 y.o. female patient who presents to office for evaluation of Right> Left foot pain secondary to callus skin. Patient complains of pain at the lesion present Right>Left foot at the big toes and left plantar 5th met head. 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 Pain sometimes unbearable and causes her to limp 7-9/10 and reports difficult to keep moisiturized and trim. Patient denies any other pedal complaints.   Review of Systems  All other systems reviewed and are negative.   There are no active problems to display for this patient.   No current outpatient medications on file prior to visit.   No current facility-administered medications on file prior to visit.     No Known Allergies  Objective:  General: Alert and oriented x3 in no acute distress  Dermatology: Keratotic lesion present medial hallux bilateral and sub met 5 on left with skin lines transversing the lesions, pain is present with direct pressure to the lesion with a central nucleated core noted, + dry scaly skin bilateral plantar surfaces in a moccassin distrubution resembling tinea bilateral, no webspace macerations, no ecchymosis bilateral, all nails x 10 are well manicured.  Vascular: Dorsalis Pedis and Posterior Tibial pedal pulses 2/4, Capillary Fill Time 3 seconds, + pedal hair growth bilateral, no edema bilateral lower extremities, Temperature gradient within normal limits.  Neurology: Johney Maine sensation intact via light touch bilateral.  Musculoskeletal: Mild tenderness with palpation at the keratotic lesion sites on Right>Left, Muscular strength 5/5 in all groups without pain or limitation on range of motion. Varus 5th hammertoe boney deformity noted.  Assessment and Plan: Problem List Items Addressed This Visit    None    Visit  Diagnoses    Bilateral foot pain    -  Primary   Relevant Medications   clotrimazole (LOTRIMIN) 1 % external solution   naftifine (NAFTIN) 1 % cream   Other Relevant Orders   DG Foot Complete Right   DG Foot Complete Left   Hepatic Function Panel   Porokeratosis       Relevant Medications   clotrimazole (LOTRIMIN) 1 % external solution   naftifine (NAFTIN) 1 % cream   Other Relevant Orders   Hepatic Function Panel   Tinea       Relevant Medications   clotrimazole (LOTRIMIN) 1 % external solution   naftifine (NAFTIN) 1 % cream   Other Relevant Orders   Hepatic Function Panel   Hammer toes of both feet         -Complete examination performed -Discussed treatment options -Parred keratoic lesions x 3 using a chisel blade; treated the area withSalinocaine covered with moleskin -Encouraged daily skin emollients as well as Naftin and Clotrimazole as Rx -Ordered LFTs to start Diflucan or Itraconazole for 12 weeks as well for advanced tinea and encouraged good hygiene habits -Encouraged use of pumice stone -Advised good supportive shoes and inserts and recommend future need of custom orthotics for foot deformity and to offload callused areas  -Patient to return to office in 4 weeks for med check and further discussion of orthotics or sooner if condition worsens.  Landis Martins, DPM

## 2018-01-09 DIAGNOSIS — Q828 Other specified congenital malformations of skin: Secondary | ICD-10-CM | POA: Diagnosis not present

## 2018-01-09 DIAGNOSIS — M79671 Pain in right foot: Secondary | ICD-10-CM | POA: Diagnosis not present

## 2018-01-09 DIAGNOSIS — M79672 Pain in left foot: Secondary | ICD-10-CM | POA: Diagnosis not present

## 2018-01-09 DIAGNOSIS — B359 Dermatophytosis, unspecified: Secondary | ICD-10-CM | POA: Diagnosis not present

## 2018-01-09 LAB — HEPATIC FUNCTION PANEL
AG Ratio: 1.5 (calc) (ref 1.0–2.5)
ALBUMIN MSPROF: 4 g/dL (ref 3.6–5.1)
ALT: 8 U/L (ref 6–29)
AST: 16 U/L (ref 10–30)
Alkaline phosphatase (APISO): 70 U/L (ref 33–115)
BILIRUBIN DIRECT: 0.1 mg/dL (ref 0.0–0.2)
GLOBULIN: 2.6 g/dL (ref 1.9–3.7)
Indirect Bilirubin: 0.1 mg/dL (calc) — ABNORMAL LOW (ref 0.2–1.2)
TOTAL PROTEIN: 6.6 g/dL (ref 6.1–8.1)
Total Bilirubin: 0.2 mg/dL (ref 0.2–1.2)

## 2018-01-13 ENCOUNTER — Telehealth: Payer: Self-pay | Admitting: *Deleted

## 2018-01-13 MED ORDER — ITRACONAZOLE 200 MG PO TABS: 200.0000 mg | ORAL_TABLET | Freq: Every day | ORAL | 0 refills | Status: DC

## 2018-01-13 NOTE — Telephone Encounter (Signed)
-----   Message from Asencion Islamitorya Stover, [place] sent at 01/10/2018  2:17 PM EST ----- LFTs are acceptable Send Itraconazole 200mg  daily x 90 days Thanks Dr. Marylene LandStover

## 2018-01-20 ENCOUNTER — Telehealth: Payer: Self-pay | Admitting: Sports Medicine

## 2018-01-20 NOTE — Telephone Encounter (Signed)
Insurance will not pay for medication, per pt, its $600.00. Mrs. Breanna Wilson, said the Pharmacy has sent paperwork over to us and has not received a response.

## 2018-01-29 ENCOUNTER — Other Ambulatory Visit: Payer: Self-pay | Admitting: Sports Medicine

## 2018-01-29 DIAGNOSIS — M722 Plantar fascial fibromatosis: Secondary | ICD-10-CM

## 2018-02-04 ENCOUNTER — Ambulatory Visit: Payer: Federal, State, Local not specified - PPO | Admitting: Sports Medicine

## 2018-02-04 ENCOUNTER — Encounter: Payer: Self-pay | Admitting: Sports Medicine

## 2018-02-04 ENCOUNTER — Telehealth: Payer: Self-pay | Admitting: *Deleted

## 2018-02-04 DIAGNOSIS — Q828 Other specified congenital malformations of skin: Secondary | ICD-10-CM

## 2018-02-04 DIAGNOSIS — M2041 Other hammer toe(s) (acquired), right foot: Secondary | ICD-10-CM

## 2018-02-04 DIAGNOSIS — M2042 Other hammer toe(s) (acquired), left foot: Secondary | ICD-10-CM

## 2018-02-04 DIAGNOSIS — M79671 Pain in right foot: Secondary | ICD-10-CM | POA: Diagnosis not present

## 2018-02-04 DIAGNOSIS — M79672 Pain in left foot: Secondary | ICD-10-CM | POA: Diagnosis not present

## 2018-02-04 DIAGNOSIS — B359 Dermatophytosis, unspecified: Secondary | ICD-10-CM

## 2018-02-04 MED ORDER — NAFTIFINE HCL 1 % EX CREA: TOPICAL_CREAM | Freq: Every day | CUTANEOUS | 4 refills | Status: DC

## 2018-02-04 MED ORDER — CLOTRIMAZOLE 1 % EX SOLN: 1.0000 "application " | Freq: Two times a day (BID) | CUTANEOUS | 5 refills | Status: DC

## 2018-02-04 MED ORDER — FLUCONAZOLE 200 MG PO TABS: 200.0000 mg | ORAL_TABLET | Freq: Every day | ORAL | 2 refills | Status: DC

## 2018-02-04 NOTE — Telephone Encounter (Signed)
-----   Message from Asencion Islamitorya Stover, [place] sent at 02/04/2018  9:00 AM EST ----- Regarding: refer to Derm Severe scaling of skin currently using clotrimazole and natifitin  Patient would like to see Dermatology as well

## 2018-02-04 NOTE — Progress Notes (Signed)
Subjective: Breanna Wilson is a 42 y.o. female patient who presents to office for follow up evaluation of Right> Left foot pain secondary to callus skin. Patient reports that her insurance did not cover Lamisil medication but cream has helped. Patient reports that she found her home pedicure set. Patient denies any other pedal complaints.   There are no active problems to display for this patient.   Current Outpatient Medications on File Prior to Visit  Medication Sig Dispense Refill  . Itraconazole 200 MG TABS Take 200 mg by mouth daily. 90 tablet 0   No current facility-administered medications on file prior to visit.     No Known Allergies  Objective:  General: Alert and oriented x3 in no acute distress  Dermatology: Keratotic lesion present medial hallux bilateral and sub met 5 on left with skin lines transversing the lesions, pain is present with direct pressure to the lesion with a central nucleated core noted, + dry scaly skin bilateral plantar surfaces in a moccassin distrubution resembling tinea bilateral, no webspace macerations, no ecchymosis bilateral, all nails x 10 are well manicured.  Vascular: Dorsalis Pedis and Posterior Tibial pedal pulses 2/4, Capillary Fill Time 3 seconds, + pedal hair growth bilateral, no edema bilateral lower extremities, Temperature gradient within normal limits.  Neurology: Johney Maine sensation intact via light touch bilateral.  Musculoskeletal: Mild tenderness with palpation at the keratotic lesion sites on Right>Left, Muscular strength 5/5 in all groups without pain or limitation on range of motion. Varus 5th hammertoe boney deformity noted.  Assessment and Plan: Problem List Items Addressed This Visit    None    Visit Diagnoses    Porokeratosis    -  Primary   Relevant Medications   naftifine (NAFTIN) 1 % cream   clotrimazole (LOTRIMIN) 1 % external solution   Tinea       Relevant Medications   naftifine (NAFTIN) 1 % cream   clotrimazole  (LOTRIMIN) 1 % external solution   fluconazole (DIFLUCAN) 200 MG tablet   Hammer toes of both feet       Bilateral foot pain       Relevant Medications   naftifine (NAFTIN) 1 % cream   clotrimazole (LOTRIMIN) 1 % external solution     -Complete examination performed -Discussed treatment options -Parred keratoic lesions x 3 using a chisel blade -Encouraged daily skin emollients as well as Naftin and Clotrimazole as Rx -Changed Lamisil to Diflucan for advanced tinea and encouraged good hygiene habits -Referral made to Dermatology for additional recs  -Encouraged use of pumice stone and self cafre -Advised good supportive shoes and inserts and recommend future need of custom orthotics for foot deformity and to offload callused areas like previous  -Patient to return to office in 5 weeks for med check and further discussion of orthotics or sooner if condition worsens.  Landis Martins, DPM

## 2018-02-05 NOTE — Telephone Encounter (Signed)
Referral, clinicals and demographics to [facility].

## 2018-03-11 ENCOUNTER — Ambulatory Visit: Payer: Federal, State, Local not specified - PPO | Admitting: Sports Medicine

## 2018-03-21 ENCOUNTER — Ambulatory Visit (HOSPITAL_COMMUNITY)
Admission: EM | Admit: 2018-03-21 | Discharge: 2018-03-21 | Disposition: A | Discharge status: Home / Self Care | Payer: Federal, State, Local not specified - PPO | Source: Home / Self Care | Attending: Family Medicine | Admitting: Family Medicine

## 2018-03-21 ENCOUNTER — Emergency Department (HOSPITAL_COMMUNITY)
Admission: EM | Admit: 2018-03-21 | Discharge: 2018-03-21 | Discharge status: Against Medical Advice | Payer: Federal, State, Local not specified - PPO | Source: Home / Self Care | Attending: Emergency Medicine | Admitting: Emergency Medicine

## 2018-03-21 ENCOUNTER — Ambulatory Visit (INDEPENDENT_AMBULATORY_CARE_PROVIDER_SITE_OTHER): Payer: Federal, State, Local not specified - PPO

## 2018-03-21 ENCOUNTER — Encounter (HOSPITAL_COMMUNITY): Payer: Self-pay | Admitting: Emergency Medicine

## 2018-03-21 ENCOUNTER — Ambulatory Visit (HOSPITAL_COMMUNITY): Payer: Federal, State, Local not specified - PPO

## 2018-03-21 ENCOUNTER — Emergency Department (HOSPITAL_COMMUNITY)
Admission: EM | Admit: 2018-03-21 | Discharge: 2018-03-21 | Disposition: A | Discharge status: Home / Self Care | Payer: Federal, State, Local not specified - PPO | Attending: Emergency Medicine | Admitting: Emergency Medicine

## 2018-03-21 DIAGNOSIS — F17210 Nicotine dependence, cigarettes, uncomplicated: Secondary | ICD-10-CM | POA: Insufficient documentation

## 2018-03-21 DIAGNOSIS — G8929 Other chronic pain: Secondary | ICD-10-CM | POA: Insufficient documentation

## 2018-03-21 DIAGNOSIS — M546 Pain in thoracic spine: Secondary | ICD-10-CM

## 2018-03-21 DIAGNOSIS — R1011 Right upper quadrant pain: Secondary | ICD-10-CM | POA: Insufficient documentation

## 2018-03-21 DIAGNOSIS — K567 Ileus, unspecified: Secondary | ICD-10-CM

## 2018-03-21 DIAGNOSIS — Z5321 Procedure and treatment not carried out due to patient leaving prior to being seen by health care provider: Secondary | ICD-10-CM | POA: Insufficient documentation

## 2018-03-21 DIAGNOSIS — R079 Chest pain, unspecified: Secondary | ICD-10-CM | POA: Diagnosis not present

## 2018-03-21 DIAGNOSIS — R109 Unspecified abdominal pain: Secondary | ICD-10-CM | POA: Diagnosis not present

## 2018-03-21 LAB — CBC
HCT: 43.7 % (ref 36.0–46.0)
Hemoglobin: 13.5 g/dL (ref 12.0–15.0)
MCH: 25.3 pg — ABNORMAL LOW (ref 26.0–34.0)
MCHC: 30.9 g/dL (ref 30.0–36.0)
MCV: 81.8 fL (ref 80.0–100.0)
Platelets: 217 10*3/uL (ref 150–400)
RBC: 5.34 MIL/uL — ABNORMAL HIGH (ref 3.87–5.11)
RDW: 14.9 % (ref 11.5–15.5)
WBC: 8 10*3/uL (ref 4.0–10.5)
nRBC: 0 % (ref 0.0–0.2)

## 2018-03-21 LAB — COMPREHENSIVE METABOLIC PANEL
ALT: 14 U/L (ref 0–44)
AST: 20 U/L (ref 15–41)
Albumin: 3.7 g/dL (ref 3.5–5.0)
Alkaline Phosphatase: 70 U/L (ref 38–126)
Anion gap: 9 (ref 5–15)
BUN: 12 mg/dL (ref 6–20)
CALCIUM: 9.3 mg/dL (ref 8.9–10.3)
CO2: 23 mmol/L (ref 22–32)
CREATININE: 0.85 mg/dL (ref 0.44–1.00)
Chloride: 104 mmol/L (ref 98–111)
GFR calc Af Amer: >60 mL/min (ref >60)
GFR calc non Af Amer: >60 mL/min (ref >60)
Glucose, Bld: 105 mg/dL — ABNORMAL HIGH (ref 70–99)
Potassium: 4 mmol/L (ref 3.5–5.1)
Sodium: 136 mmol/L (ref 135–145)
Total Bilirubin: 0.7 mg/dL (ref 0.3–1.2)
Total Protein: 7.1 g/dL (ref 6.5–8.1)

## 2018-03-21 LAB — I-STAT BETA HCG BLOOD, ED (MC, WL, AP ONLY): I-stat hCG, quantitative: <5 m[IU]/mL (ref <5)

## 2018-03-21 LAB — LIPASE, BLOOD: Lipase: 24 U/L (ref 11–51)

## 2018-03-21 NOTE — ED Provider Notes (Signed)
[facility]    CSN: [id number] Arrival date & time: 03/21/18  1136     History   Chief Complaint Chief Complaint  Patient presents with  . Back Pain    HPI Breanna Wilson is a 43 y.o. female.   HPI  Patient checked in to be seen for back pain.  Said the pain was in her right upper back.  Going to her right shoulder blade region.  She states that it was getting worse over time.  It was worse with a deep breath.  It was worse with position.  She states she was having spasms in her back and having difficult time getting comfortable.  No history of kidney stones.  No history of back problems.  She had no fall or injury.  No overuse.  She had not taken any medicine for her back pain. Initially stated that she was otherwise well, later said that she has been having for a couple of years abdominal pain and spells.  Sometimes doubles her over.  Sometimes associated with nausea.  She had not associated this with meals.  Usually just takes Tums and it will go away.  She has not had this evaluated by a physician.  She does not have a PCP.  History reviewed. No pertinent past medical history.  There are no active problems to display for this patient.   Past Surgical History:  Procedure Laterality Date  . TUBAL LIGATION      OB History   No obstetric history on file.      Home Medications    Prior to Admission medications   Not on File    Family History Family History  Problem Relation Age of Onset  . Hypertension Mother     Social History Social History   Tobacco Use  . Smoking status: Current Every Day Smoker    Packs/day: 0.50    Types: Cigarettes  . Smokeless tobacco: Never Used  Substance Use Topics  . Alcohol use: Yes  . Drug use: No     Allergies   Patient has no known allergies.   Review of Systems Review of Systems  Constitutional: Negative for chills and fever.  HENT: Negative for ear pain and sore throat.   Eyes: Negative for pain  and visual disturbance.  Respiratory: Negative for cough and shortness of breath.   Cardiovascular: Negative for chest pain and palpitations.  Gastrointestinal: Positive for abdominal pain and nausea. Negative for vomiting.  Genitourinary: Negative for dysuria and hematuria.  Musculoskeletal: Positive for back pain. Negative for arthralgias.  Skin: Negative for color change and rash.  Neurological: Negative for seizures and syncope.  All other systems reviewed and are negative.    Physical Exam Triage Vital Signs ED Triage Vitals  Enc Vitals Group     BP 03/21/18 1226 109/66     Pulse Rate 03/21/18 1226 86     Resp 03/21/18 1226 20     Temp 03/21/18 1226 97.7 F (36.5 C)     Temp src --      SpO2 03/21/18 1226 100 %     Weight --      Height --      Head Circumference --      Peak Flow --      Pain Score 03/21/18 1228 6     Pain Loc --      Pain Edu? --      Excl. in GC? --    No data  found.  Updated Vital Signs BP 109/66   Pulse 86   Temp 97.7 F (36.5 C)   Resp 20   SpO2 100%   Visual Acuity Right Eye Distance:   Left Eye Distance:   Bilateral Distance:    Right Eye Near:   Left Eye Near:    Bilateral Near:     Physical Exam Constitutional:      General: She is not in acute distress.    Appearance: She is well-developed and normal weight.     Comments: Patient is uncomfortable.  Changes position frequently.  Has difficulty lying on exam table.  HENT:     Head: Normocephalic and atraumatic.     Mouth/Throat:     Mouth: Mucous membranes are moist.  Eyes:     Conjunctiva/sclera: Conjunctivae normal.     Pupils: Pupils are equal, round, and reactive to light.  Neck:     Musculoskeletal: Normal range of motion and neck supple.  Cardiovascular:     Rate and Rhythm: Normal rate and regular rhythm.     Heart sounds: Normal heart sounds.  Pulmonary:     Effort: Pulmonary effort is normal. No respiratory distress.     Breath sounds: Normal breath sounds.       Comments: No CVA tenderness Abdominal:     General: Abdomen is flat. Bowel sounds are normal. There is no distension.     Palpations: Abdomen is soft.     Tenderness: There is abdominal tenderness.     Comments: Patient has tenderness to palpation in the midepigastrium in the right upper quadrant.  Mild guarding.  No rebound.  No organomegaly.  Musculoskeletal: Normal range of motion.     Comments: No tenderness in the muscles of the back.  Strength sensation range of motion and reflexes are normal in both lower extremities  Lymphadenopathy:     Cervical: No cervical adenopathy.  Skin:    General: Skin is warm and dry.  Neurological:     Mental Status: She is alert.      UC Treatments / Results  Labs (all labs ordered are listed, but only abnormal results are displayed) Labs Reviewed - No data to display  EKG None  Radiology Dg Abdomen Acute W/chest  Result Date: 03/21/2018 CLINICAL DATA:  Chest pain EXAM: DG ABDOMEN ACUTE W/ 1V CHEST COMPARISON:  None. FINDINGS: Normal heart size. Lungs clear. No pneumothorax. No pleural effusion. There is no free intraperitoneal gas. Generalized distention of both small and large bowel is present across the abdomen in an ileus pattern. No pneumatosis. Round calcifications projecting over the pelvis are likely phleboliths. IMPRESSION: No active cardiopulmonary disease. Ileus pattern in the abdomen. No free intraperitoneal gas to suggest ruptured bowel. Electronically Signed   By: Jolaine ClickArthur  Hoss M.D.   On: 03/21/2018 14:00    Procedures Procedures (including critical care time)  Medications Ordered in UC Medications - No data to display  Initial Impression / Assessment and Plan / UC Course  I have reviewed the triage vital signs and the nursing notes.  Pertinent labs & imaging results that were available during my care of the patient were reviewed by me and considered in my medical decision making (see chart for details).     Because  the patient is having pain in her right shoulder blade area with abdominal pain and ileus on x-ray, I thought she might have a gallstone ileus.  No other reason for ileus based on history.  That cannot be evaluated  at the urgent care center so I told her she needed to go to the emergency room for additional evaluation and possible scanning of her abdomen. Final Clinical Impressions(s) / UC Diagnoses   Final diagnoses:  Abdominal pain, chronic, right upper quadrant  Ileus [facility])     Discharge Instructions     Suspect that you have a gallstone Need to go to the ER for evaluation   ED Prescriptions    None     Controlled Substance Prescriptions [place] Controlled Substance Registry consulted? Not Applicable   Eustace Moore, MD 03/21/18 2209

## 2018-03-21 NOTE — ED Triage Notes (Signed)
Pt reports that she was at [facility] for her abd pains and back pains. Had xray and was told to go to ED for gallstones. Pt went to [facility] ED and had to wait so left.

## 2018-03-21 NOTE — Discharge Instructions (Signed)
Suspect that you have a gallstone Need to go to the ER for evaluation

## 2018-03-21 NOTE — ED Triage Notes (Signed)
Sent here from [facility] for gallstone work up. Reports right back pain worsened with movement but reports when the provider began palpating her abdomen she had significant pain. Pt in NAD.

## 2018-03-21 NOTE — ED Triage Notes (Signed)
Pt c/o R upper back pain, states positional changes make it hurt worse, states when she takes a deep breath she can feel it in her side, states shes having spasms in the upper part of her back. For a few days.

## 2018-03-21 NOTE — ED Notes (Signed)
In to Room to speak with patient about her concerns over CT delay. CT states patient declining IV, therefore CT has not been done. Patient angry and yelling at this writer-pointing finger at this writer yelling "tell me some bullshit and lies"-attempt to de-escalate patient unsuccessful-patient in this writer's personal space and yelling-threatening posture-patient yelling"get out of my room heiffer"-security called to escort patient off property. EDPA Jeraldine Loots aware and patient would not speak with EDPA-continues to yell and curse at staff-escorted off property by security with minor child.

## 2018-03-21 NOTE — ED Provider Notes (Cosign Needed)
[facility]EMERGENCY DEPT Provider Note   CSN: [id number] Arrival date & time: 03/21/18  1640     History   Chief Complaint Chief Complaint  Patient presents with  . Abdominal Pain    HPI Breanna Wilson is a 43 y.o. female  Who presents today for evaluation of right sided upper back pain that started about 1 week ago.  She reports nausea with out vomiting.  No diarrhea.  She was initially seen [facility] and they were concerned for gall stones and recommended she go to the ED. she reportedly went to [facility] where she states that she had blood drawn and got tired of waiting so she came here.  Chart review shows she has completed CBC, CMP, lipase, and i-STAT hCG from today.  She reports that the pain is primarily in the right side of her back.  She denies any dysuria, increased frequency or urgency.  She has previously had a bilateral tubal ligation.  HPI  History reviewed. No pertinent past medical history.  There are no active problems to display for this patient.   Past Surgical History:  Procedure Laterality Date  . TUBAL LIGATION       OB History   No obstetric history on file.      Home Medications    Prior to Admission medications   Not on File    Family History Family History  Problem Relation Age of Onset  . Hypertension Mother     Social History Social History   Tobacco Use  . Smoking status: Current Every Day Smoker    Packs/day: 0.50    Types: Cigarettes  . Smokeless tobacco: Never Used  Substance Use Topics  . Alcohol use: Yes  . Drug use: No     Allergies   Patient has no known allergies.   Review of Systems Review of Systems  Constitutional: Negative for chills and fever.  HENT: Negative for congestion.   Respiratory: Negative for chest tightness and shortness of breath.   Gastrointestinal: Positive for abdominal pain and nausea. Negative for constipation, diarrhea and vomiting.    Genitourinary: Negative for difficulty urinating, dysuria, frequency, hematuria, urgency and vaginal discharge.  Neurological: Negative for weakness and headaches.  All other systems reviewed and are negative.    Physical Exam Updated Vital Signs BP 132/83 (BP Location: Right Arm)   Pulse 71   Temp 98.3 F (36.8 C) (Oral)   Resp 17   SpO2 100%   Physical Exam Vitals signs and nursing note reviewed.  Constitutional:      General: She is not in acute distress.    Appearance: She is well-developed. She is not toxic-appearing.  HENT:     Head: Normocephalic and atraumatic.     Mouth/Throat:     Mouth: Mucous membranes are moist.  Eyes:     Conjunctiva/sclera: Conjunctivae normal.  Neck:     Musculoskeletal: Neck supple.  Cardiovascular:     Rate and Rhythm: Normal rate and regular rhythm.     Heart sounds: Normal heart sounds. No murmur.  Pulmonary:     Effort: Pulmonary effort is normal. No respiratory distress.     Breath sounds: Normal breath sounds.  Abdominal:     General: Abdomen is flat. Bowel sounds are normal.     Palpations: Abdomen is soft.     Tenderness: There is abdominal tenderness in the right upper quadrant and epigastric area. There is no right CVA tenderness, left  CVA tenderness, guarding or rebound. Negative signs include Murphy's sign and McBurney's sign.     Hernia: No hernia is present.  Musculoskeletal:     Comments: Unable to recreate her back pain with palpation.   Skin:    General: Skin is warm and dry.  Neurological:     General: No focal deficit present.     Mental Status: She is alert.  Psychiatric:        Mood and Affect: Mood normal.      ED Treatments / Results  Labs (all labs ordered are listed, but only abnormal results are displayed) Labs Reviewed  URINALYSIS, ROUTINE W REFLEX MICROSCOPIC    EKG None  Radiology Dg Abdomen Acute W/chest  Result Date: 03/21/2018 CLINICAL DATA:  Chest pain EXAM: DG ABDOMEN ACUTE W/ 1V  CHEST COMPARISON:  None. FINDINGS: Normal heart size. Lungs clear. No pneumothorax. No pleural effusion. There is no free intraperitoneal gas. Generalized distention of both small and large bowel is present across the abdomen in an ileus pattern. No pneumatosis. Round calcifications projecting over the pelvis are likely phleboliths. IMPRESSION: No active cardiopulmonary disease. Ileus pattern in the abdomen. No free intraperitoneal gas to suggest ruptured bowel. Electronically Signed   By: Jolaine Click M.D.   On: 03/21/2018 14:00    Procedures Procedures (including critical care time)  Medications Ordered in ED Medications - No data to display   Initial Impression / Assessment and Plan / ED Course  I have reviewed the triage vital signs and the nursing notes.  Pertinent labs & imaging results that were available during my care of the patient were reviewed by me and considered in my medical decision making (see chart for details).  Clinical Course as of Mar 21 2301  Caleen Essex Mar 21, 2018  2004 Was informed that patient was reportedly threatening staff, was upset over waiting for her CT scan, however would refuse IV.  She continued to escalate and was escorted off property.    [EH]    Clinical Course User Index [EH] Cristina Gong, PA-C   Patient presents today for evaluation of right-sided back pain.  She was seen at urgent care where she reportedly had right upper quadrant abdominal pain, acute abdomen series was performed showing concern for ileus.  She initially went to [facility] before coming here.  Labs from them were reviewed.  Patient does have slight epigastric and right upper quadrant pain however most of her pain is in her back.  Unable to re-create this with palpation.  UA was ordered, however not obtained.  CT scan orders placed.  While in the department patient got agitated, was reportedly escalating and started to yell at staff causing concern for safety.  She was escorted off  property. She refused to see me to discuss return precautions or additional care.   Final Clinical Impressions(s) / ED Diagnoses   Final diagnoses:  Right upper quadrant abdominal pain  Acute right-sided thoracic back pain    ED Discharge Orders    None       Cristina Gong, [place] 03/21/18 2307

## 2018-03-22 ENCOUNTER — Other Ambulatory Visit: Payer: Self-pay

## 2018-03-22 ENCOUNTER — Emergency Department (HOSPITAL_COMMUNITY)
Admission: EM | Admit: 2018-03-22 | Discharge: 2018-03-22 | Disposition: A | Discharge status: Home / Self Care | Payer: Federal, State, Local not specified - PPO | Attending: Emergency Medicine | Admitting: Emergency Medicine

## 2018-03-22 ENCOUNTER — Encounter (HOSPITAL_COMMUNITY): Payer: Self-pay | Admitting: Emergency Medicine

## 2018-03-22 DIAGNOSIS — Y998 Other external cause status: Secondary | ICD-10-CM | POA: Insufficient documentation

## 2018-03-22 DIAGNOSIS — Y939 Activity, unspecified: Secondary | ICD-10-CM | POA: Insufficient documentation

## 2018-03-22 DIAGNOSIS — S29012A Strain of muscle and tendon of back wall of thorax, initial encounter: Secondary | ICD-10-CM | POA: Insufficient documentation

## 2018-03-22 DIAGNOSIS — Y929 Unspecified place or not applicable: Secondary | ICD-10-CM | POA: Insufficient documentation

## 2018-03-22 DIAGNOSIS — F17210 Nicotine dependence, cigarettes, uncomplicated: Secondary | ICD-10-CM | POA: Diagnosis not present

## 2018-03-22 DIAGNOSIS — Y33XXXA Other specified events, undetermined intent, initial encounter: Secondary | ICD-10-CM | POA: Diagnosis not present

## 2018-03-22 DIAGNOSIS — M5489 Other dorsalgia: Secondary | ICD-10-CM | POA: Diagnosis not present

## 2018-03-22 DIAGNOSIS — S29011A Strain of muscle and tendon of front wall of thorax, initial encounter: Secondary | ICD-10-CM | POA: Diagnosis not present

## 2018-03-22 DIAGNOSIS — T148XXA Other injury of unspecified body region, initial encounter: Secondary | ICD-10-CM

## 2018-03-22 MED ORDER — METHOCARBAMOL 500 MG PO TABS: 500.0000 mg | ORAL_TABLET | Freq: Two times a day (BID) | ORAL | 0 refills | Status: AC

## 2018-03-22 NOTE — ED Triage Notes (Signed)
Pt reports going to UC for back pain and was referred here for a possible gall stone. Pt then came here and left without being seen from here yesterday. Pt states [facility] then [facility] and was kicked out. Pt reports lower thoracic right sided back pain. Pt reported minor nausea but no vomiting.

## 2018-03-22 NOTE — ED Notes (Signed)
Declined W/C at D/C and was escorted to lobby by RN. 

## 2018-03-22 NOTE — Discharge Instructions (Addendum)
Please read attached information. If you experience any new or worsening signs or symptoms please return to the emergency room for evaluation. Please follow-up with your primary care provider or specialist as discussed. Please use medication prescribed only as directed and discontinue taking if you have any concerning signs or symptoms.   °

## 2018-03-22 NOTE — ED Provider Notes (Signed)
[facility] EMERGENCY DEPARTMENT Provider Note  CSN: [id number] Arrival date & time: 03/22/18  6720  History   Chief Complaint Chief Complaint  Patient presents with  . Back Pain    HPI Breanna Wilson is a 43 y.o. female.  HPI   43 year old female presents today with complaints of back pain.  She notes approximate 1 week ago she developed pain to her mid back.  She notes she felt like she pulled a muscle in her back, she notes the pain started radiating up into her upper back and neck, crampy in nature.  Patient notes that she chronically has nausea and has no significant change from then.  She denies any vomiting fever diarrhea.  She notes she is passing gas but has not had a bowel movement in 2 days as she has not been eating a lot.  She denies any abdominal pain today.  She notes that yesterday she went to urgent care for back pain with no complaints of abdominal pain, they pushed on her abdomen causing some minor pain in her belly.  She was instructed to come to the emergency room for a CT scan.  She became very anxious and agitated as she did not know what was going on.  She notes she was hungry and lost her temper and was kicked out of [facility].  He notes since leaving the hospital symptoms have improved with only minor back pain, she continues to deny any vomiting abdominal pain or any other complaints.  Patient feels that this is her back.   History reviewed. No pertinent past medical history.  There are no active problems to display for this patient.   Past Surgical History:  Procedure Laterality Date  . TUBAL LIGATION       OB History   No obstetric history on file.      Home Medications    Prior to Admission medications   Medication Sig Start Date End Date Taking? Authorizing Provider  methocarbamol (ROBAXIN) 500 MG tablet Take 1 tablet (500 mg total) by mouth 2 (two) times daily. 03/22/18   Eyvonne Mechanic, PA-C    Family History Family  History  Problem Relation Age of Onset  . Hypertension Mother     Social History Social History   Tobacco Use  . Smoking status: Current Every Day Smoker    Packs/day: 0.50    Types: Cigarettes  . Smokeless tobacco: Never Used  Substance Use Topics  . Alcohol use: Yes  . Drug use: No     Allergies   Patient has no known allergies.   Review of Systems Review of Systems  All other systems reviewed and are negative.    Physical Exam Updated Vital Signs BP 115/85 (BP Location: Right Arm)   Pulse 91   Temp 98.2 F (36.8 C) (Oral)   Resp 18   Ht 5\' 1"  (1.549 m)   Wt 59 kg   LMP 12/11/2017 (Approximate)   SpO2 99%   BMI 24.56 kg/m   Physical Exam Vitals signs and nursing note reviewed.  Constitutional:      Appearance: She is well-developed.  HENT:     Head: Normocephalic and atraumatic.  Eyes:     General: No scleral icterus.       Right eye: No discharge.        Left eye: No discharge.     Conjunctiva/sclera: Conjunctivae normal.     Pupils: Pupils are equal, round, and reactive to light.  Neck:     Musculoskeletal: Normal range of motion.     Vascular: No JVD.     Trachea: No tracheal deviation.  Pulmonary:     Effort: Pulmonary effort is normal.     Breath sounds: No stridor.  Abdominal:     Comments: Abdomen soft nontender  Musculoskeletal:     Comments: Atraumatic with no midline tenderness, minimal tenderness palpation of the right lateral thoracic musculature, bilateral upper extremity sensation strength motor function intact  Neurological:     Mental Status: She is alert and oriented to person, place, and time.     Coordination: Coordination normal.  Psychiatric:        Behavior: Behavior normal.        Thought Content: Thought content normal.        Judgment: Judgment normal.     ED Treatments / Results  Labs (all labs ordered are listed, but only abnormal results are displayed) Labs Reviewed - No data to  display  EKG None  Radiology Dg Abdomen Acute W/chest  Result Date: 03/21/2018 CLINICAL DATA:  Chest pain EXAM: DG ABDOMEN ACUTE W/ 1V CHEST COMPARISON:  None. FINDINGS: Normal heart size. Lungs clear. No pneumothorax. No pleural effusion. There is no free intraperitoneal gas. Generalized distention of both small and large bowel is present across the abdomen in an ileus pattern. No pneumatosis. Round calcifications projecting over the pelvis are likely phleboliths. IMPRESSION: No active cardiopulmonary disease. Ileus pattern in the abdomen. No free intraperitoneal gas to suggest ruptured bowel. Electronically Signed   By: Jolaine ClickArthur  Hoss M.D.   On: 03/21/2018 14:00    Procedures Procedures (including critical care time)  Medications Ordered in ED Medications - No data to display   Initial Impression / Assessment and Plan / ED Course  I have reviewed the triage vital signs and the nursing notes.  Pertinent labs & imaging results that were available during my care of the patient were reviewed by me and considered in my medical decision making (see chart for details).     Labs:   Imaging:  Consults:  Therapeutics:  Discharge Meds: Robaxin  Assessment/Plan: 43 year old female presents today with complaints of back pain I have very high suspicion for musculoskeletal back pain I have very low suspicion for any anterior abdominal pathology.  Her x-ray from the other day of her abdomen shows potential ileus, patient is passing gas has no abdominal pain no nausea or vomiting.  Patient will be treated symptomatically for back pain, she will return immediately if she develops any new or worsening signs or symptoms.  Patient is very happy with today's plan and has no further questions or concerns at the time of discharge.   Final Clinical Impressions(s) / ED Diagnoses   Final diagnoses:  Muscle strain    ED Discharge Orders         Ordered    methocarbamol (ROBAXIN) 500 MG tablet  2  times daily     03/22/18 0829           Eyvonne MechanicHedges, Glenda Kunst, PA-C 03/22/18 0832    Melene PlanFloyd, Dan, DO 03/22/18 1458

## 2018-03-26 ENCOUNTER — Other Ambulatory Visit: Payer: Self-pay | Admitting: Obstetrics and Gynecology

## 2018-03-27 ENCOUNTER — Other Ambulatory Visit: Payer: Self-pay | Admitting: Obstetrics and Gynecology

## 2018-03-27 DIAGNOSIS — Z113 Encounter for screening for infections with a predominantly sexual mode of transmission: Secondary | ICD-10-CM | POA: Diagnosis not present

## 2018-03-27 DIAGNOSIS — Z6824 Body mass index (BMI) 24.0-24.9, adult: Secondary | ICD-10-CM | POA: Diagnosis not present

## 2018-03-27 DIAGNOSIS — Z118 Encounter for screening for other infectious and parasitic diseases: Secondary | ICD-10-CM | POA: Diagnosis not present

## 2018-03-27 DIAGNOSIS — Z1159 Encounter for screening for other viral diseases: Secondary | ICD-10-CM | POA: Diagnosis not present

## 2018-03-27 DIAGNOSIS — Z01419 Encounter for gynecological examination (general) (routine) without abnormal findings: Secondary | ICD-10-CM | POA: Diagnosis not present

## 2018-03-27 DIAGNOSIS — Z1231 Encounter for screening mammogram for malignant neoplasm of breast: Secondary | ICD-10-CM | POA: Diagnosis not present

## 2018-03-27 DIAGNOSIS — N924 Excessive bleeding in the premenopausal period: Secondary | ICD-10-CM | POA: Diagnosis not present

## 2018-03-27 DIAGNOSIS — Z1151 Encounter for screening for human papillomavirus (HPV): Secondary | ICD-10-CM | POA: Diagnosis not present

## 2018-03-27 DIAGNOSIS — Z114 Encounter for screening for human immunodeficiency virus [HIV]: Secondary | ICD-10-CM | POA: Diagnosis not present

## 2018-04-02 DIAGNOSIS — R110 Nausea: Secondary | ICD-10-CM | POA: Diagnosis not present

## 2018-04-02 DIAGNOSIS — R42 Dizziness and giddiness: Secondary | ICD-10-CM | POA: Diagnosis not present

## 2018-04-02 DIAGNOSIS — R1011 Right upper quadrant pain: Secondary | ICD-10-CM | POA: Diagnosis not present

## 2018-04-24 DIAGNOSIS — F322 Major depressive disorder, single episode, severe without psychotic features: Secondary | ICD-10-CM | POA: Diagnosis not present

## 2018-05-01 DIAGNOSIS — F322 Major depressive disorder, single episode, severe without psychotic features: Secondary | ICD-10-CM | POA: Diagnosis not present

## 2018-05-08 DIAGNOSIS — Q828 Other specified congenital malformations of skin: Secondary | ICD-10-CM | POA: Diagnosis not present

## 2018-05-08 DIAGNOSIS — F322 Major depressive disorder, single episode, severe without psychotic features: Secondary | ICD-10-CM | POA: Diagnosis not present

## 2018-05-13 ENCOUNTER — Telehealth: Payer: Self-pay | Admitting: Neurology

## 2018-05-13 NOTE — Telephone Encounter (Signed)
Fail to reach patient. Could not leave message.

## 2018-05-14 ENCOUNTER — Ambulatory Visit (INDEPENDENT_AMBULATORY_CARE_PROVIDER_SITE_OTHER): Payer: Federal, State, Local not specified - PPO | Admitting: Neurology

## 2018-05-14 ENCOUNTER — Other Ambulatory Visit: Payer: Self-pay

## 2018-05-14 ENCOUNTER — Encounter: Payer: Self-pay | Admitting: Neurology

## 2018-05-14 VITALS — BP 107/73 | HR 72 | Ht 61.0 in | Wt 141.5 lb

## 2018-05-14 DIAGNOSIS — R42 Dizziness and giddiness: Secondary | ICD-10-CM

## 2018-05-14 MED ORDER — TOPIRAMATE 50 MG PO TABS: 100.0000 mg | ORAL_TABLET | Freq: Every day | ORAL | 11 refills | Status: AC

## 2018-05-14 MED ORDER — RIZATRIPTAN BENZOATE 5 MG PO TBDP: 5.0000 mg | ORAL_TABLET | ORAL | 6 refills | Status: AC | PRN

## 2018-05-14 MED ORDER — ONDANSETRON 4 MG PO TBDP: 4.0000 mg | ORAL_TABLET | Freq: Three times a day (TID) | ORAL | 6 refills | Status: AC | PRN

## 2018-05-14 NOTE — Progress Notes (Signed)
PATIENT: Breanna Wilson DOB: 03-28-1975  Chief Complaint  Patient presents with  . New Patient (Initial Visit)    Referred by Dr. Elnoria Howard  . Dizziness    Rm Hall,  since october, feelings of dizziness, (like carsickness), nausea.     HISTORICAL  Breanna Wilson is a 43 years old female, seen in request by Dr. Elnoria Howard, Luisa Hart for evaluation of recurrent dizziness, initial evaluation was on May 14, 2018.  I have reviewed and summarized the referring note from the referring physician.  She reported history of intermittent dizziness, sometimes preceded by visual changes, seeing transparent sparkles in her visual field for few minutes, the dizziness lightheaded sensation usually triggered by car riding, with nausea, but no significant headache, improved by lying down in dark sleep for couple hours,  She began to have increased recurrent spells of dizziness nausea since October 2019, this correlated with her increased working schedule, during winter months, she can work up to 12 hours for 6 days, overnight shifts, with prolonged shift, missing her meals, she would develop lightheaded, nauseous sensation, sometimes preceded by sparkles in her visual field, relieved by lying down in dark sleep for few hours, the other common triggers are writing at the passenger side, this episode happened 2-3 times each week, she denies significant headaches, no visual loss, no hearing loss, no lateralized motor or sensory deficit.  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   REVIEW OF SYSTEMS: Full 14 system review of systems performed and notable only for as above All other review of systems were negative.  ALLERGIES: No Known Allergies  HOME MEDICATIONS: Current Outpatient Medications  Medication Sig Dispense Refill  . methocarbamol (ROBAXIN) 500 MG tablet Take 1 tablet (500 mg total) by mouth 2 (two) times daily. 20 tablet 0   No current  facility-administered medications for this visit.     PAST MEDICAL HISTORY: Past Medical History:  Diagnosis Date  . Anxiety   . Depression     PAST SURGICAL HISTORY: Past Surgical History:  Procedure Laterality Date  . TUBAL LIGATION      FAMILY HISTORY: Family History  Problem Relation Age of Onset  . Hypertension Mother   . Breast cancer Maternal Aunt   . Lung cancer Maternal Uncle     SOCIAL HISTORY: Social History   Socioeconomic History  . Marital status: Single    Spouse name: Not on file  . Number of children: Not on file  . Years of education: Not on file  . Highest education level: Not on file  Occupational History  . Not on file  Social Needs  . Financial resource strain: Not on file  . Food insecurity:    Worry: Not on file    Inability: Not on file  . Transportation needs:    Medical: Not on file    Non-medical: Not on file  Tobacco Use  . Smoking status: Current Every Day Smoker    Packs/day: 0.50    Types: Cigarettes  . Smokeless tobacco: Never Used  Substance and Sexual Activity  . Alcohol use: Yes    Comment: few drinks per month  . Drug use: No  . Sexual activity: Not on file  Lifestyle  . Physical activity:    Days per week: Not on file    Minutes per session: Not on file  . Stress: Not on file  Relationships  .  Social connections:    Talks on phone: Not on file    Gets together: Not on file    Attends religious service: Not on file    Active member of club or organization: Not on file    Attends meetings of clubs or organizations: Not on file    Relationship status: Not on file  . Intimate partner violence:    Fear of current or ex partner: Not on file    Emotionally abused: Not on file    Physically abused: Not on file    Forced sexual activity: Not on file  Other Topics Concern  . Not on file  Social History Narrative   Lives at home with 2 adult children.  Education HS diploma,  Children 3,  Works for [employer].  Single.      PHYSICAL EXAM   Vitals:   05/14/18 0721  BP: 107/73  Pulse: 72  Weight: 141 lb 8 oz (64.2 kg)  Height: 5\' 1"  (1.549 m)    Not recorded      Body mass index is 26.74 kg/m.  PHYSICAL EXAMNIATION:  Gen: NAD, conversant, well nourised, obese, well groomed                     Cardiovascular: Regular rate rhythm, no peripheral edema, warm, nontender. Eyes: Conjunctivae clear without exudates or hemorrhage Neck: Supple, no carotid bruits. Pulmonary: Clear to auscultation bilaterally   NEUROLOGICAL EXAM:  MENTAL STATUS: Speech:    Speech is normal; fluent and spontaneous with normal comprehension.  Cognition:     Orientation to time, place and person     Normal recent and remote memory     Normal Attention span and concentration     Normal Language, naming, repeating,spontaneous speech     Fund of knowledge   CRANIAL NERVES: CN II: Visual fields are full to confrontation.   Pupils are round equal and briskly reactive to light. CN III, IV, VI: extraocular movement are normal. No ptosis. CN V: Facial sensation is intact to pinprick in all 3 divisions bilaterally. Corneal responses are intact.  CN VII: Face is symmetric with normal eye closure and smile. CN VIII: Hearing is normal to rubbing fingers CN IX, X: Palate elevates symmetrically. Phonation is normal. CN XI: Head turning and shoulder shrug are intact CN XII: Tongue is midline with normal movements and no atrophy.  MOTOR: There is no pronator drift of out-stretched arms. Muscle bulk and tone are normal. Muscle strength is normal.  REFLEXES: Reflexes are 2+ and symmetric at the biceps, triceps, knees, and ankles. Plantar responses are flexor.  SENSORY: Intact to light touch, pinprick, positional sensation and vibratory sensation are intact in fingers and toes.  COORDINATION: Rapid alternating movements and fine finger movements are intact. There is no dysmetria on finger-to-nose and heel-knee-shin.     GAIT/STANCE: Posture is normal. Gait is steady with normal steps, base, arm swing, and turning. Heel and toe walking are normal. Tandem gait is normal.  Romberg is absent.   DIAGNOSTIC DATA (LABS, IMAGING, TESTING) - I reviewed patient records, labs, notes, testing and imaging myself where available.   ASSESSMENT AND PLAN  KEAMBRIA REETER is a 43 y.o. female   Recurrent spells of nausea, dizziness, often preceded by visual disturbance  Likely migraine variant,  Start preventive medication Topamax 50 mg titrating to 100 mg every night  Zofran, Maxalt as needed for more prolonged severe episode.   Levert Feinstein, M.D. Ph.D.  [facility] [address] [place], [place] [address] Ph: [phone] Fax: [phone]  CC: Jeani Hawking, MD

## 2018-08-19 ENCOUNTER — Telehealth: Payer: Self-pay

## 2018-08-19 NOTE — Telephone Encounter (Signed)
Unable to get in contact with the patient to convert her office visit into a mychart video visit with Judson Roch. No option to lvm due to the mailbox being full. I will attempt to call back later.    If patient calls back please convert their appt into a mychart video visit.

## 2018-08-20 ENCOUNTER — Ambulatory Visit: Payer: Federal, State, Local not specified - PPO | Admitting: Neurology

## 2018-09-23 DIAGNOSIS — R110 Nausea: Secondary | ICD-10-CM | POA: Diagnosis not present

## 2018-09-23 DIAGNOSIS — R030 Elevated blood-pressure reading, without diagnosis of hypertension: Secondary | ICD-10-CM | POA: Diagnosis not present

## 2018-09-23 DIAGNOSIS — R5383 Other fatigue: Secondary | ICD-10-CM | POA: Diagnosis not present

## 2018-12-08 DIAGNOSIS — M79672 Pain in left foot: Secondary | ICD-10-CM | POA: Diagnosis not present

## 2018-12-08 DIAGNOSIS — M79671 Pain in right foot: Secondary | ICD-10-CM | POA: Diagnosis not present

## 2018-12-08 DIAGNOSIS — B079 Viral wart, unspecified: Secondary | ICD-10-CM | POA: Diagnosis not present

## 2018-12-08 DIAGNOSIS — M21612 Bunion of left foot: Secondary | ICD-10-CM | POA: Diagnosis not present

## 2018-12-08 DIAGNOSIS — M21611 Bunion of right foot: Secondary | ICD-10-CM | POA: Diagnosis not present

## 2018-12-24 DIAGNOSIS — B079 Viral wart, unspecified: Secondary | ICD-10-CM | POA: Diagnosis not present

## 2019-01-19 DIAGNOSIS — R5383 Other fatigue: Secondary | ICD-10-CM | POA: Diagnosis not present

## 2019-01-19 DIAGNOSIS — R1013 Epigastric pain: Secondary | ICD-10-CM | POA: Diagnosis not present

## 2019-01-26 DIAGNOSIS — R718 Other abnormality of red blood cells: Secondary | ICD-10-CM | POA: Diagnosis not present

## 2019-01-26 DIAGNOSIS — R1011 Right upper quadrant pain: Secondary | ICD-10-CM | POA: Diagnosis not present

## 2019-01-26 DIAGNOSIS — K5900 Constipation, unspecified: Secondary | ICD-10-CM | POA: Diagnosis not present

## 2019-01-29 ENCOUNTER — Other Ambulatory Visit: Payer: Self-pay | Admitting: Physician Assistant

## 2019-01-29 DIAGNOSIS — R1013 Epigastric pain: Secondary | ICD-10-CM

## 2019-01-29 DIAGNOSIS — R5383 Other fatigue: Secondary | ICD-10-CM

## 2019-02-06 DIAGNOSIS — R1011 Right upper quadrant pain: Secondary | ICD-10-CM | POA: Diagnosis not present

## 2019-02-17 DIAGNOSIS — Z1159 Encounter for screening for other viral diseases: Secondary | ICD-10-CM | POA: Diagnosis not present

## 2019-02-23 DIAGNOSIS — K2970 Gastritis, unspecified, without bleeding: Secondary | ICD-10-CM | POA: Diagnosis not present

## 2019-02-23 DIAGNOSIS — R1013 Epigastric pain: Secondary | ICD-10-CM | POA: Diagnosis not present

## 2019-02-23 DIAGNOSIS — K2990 Gastroduodenitis, unspecified, without bleeding: Secondary | ICD-10-CM | POA: Diagnosis not present

## 2019-02-23 DIAGNOSIS — K2980 Duodenitis without bleeding: Secondary | ICD-10-CM | POA: Diagnosis not present

## 2019-02-23 DIAGNOSIS — R1011 Right upper quadrant pain: Secondary | ICD-10-CM | POA: Diagnosis not present

## 2019-11-26 DIAGNOSIS — Z113 Encounter for screening for infections with a predominantly sexual mode of transmission: Secondary | ICD-10-CM | POA: Diagnosis not present

## 2019-11-26 DIAGNOSIS — G5603 Carpal tunnel syndrome, bilateral upper limbs: Secondary | ICD-10-CM | POA: Diagnosis not present

## 2019-11-26 DIAGNOSIS — M21619 Bunion of unspecified foot: Secondary | ICD-10-CM | POA: Diagnosis not present

## 2019-11-26 DIAGNOSIS — L304 Erythema intertrigo: Secondary | ICD-10-CM | POA: Diagnosis not present

## 2019-11-27 IMAGING — DX DG ABDOMEN ACUTE W/ 1V CHEST
3 series · 3 of 3 positions shown · non-contrast
Comparison: None.

CLINICAL DATA: Chest pain

EXAM:
DG ABDOMEN ACUTE W/ 1V CHEST

[chest pa]
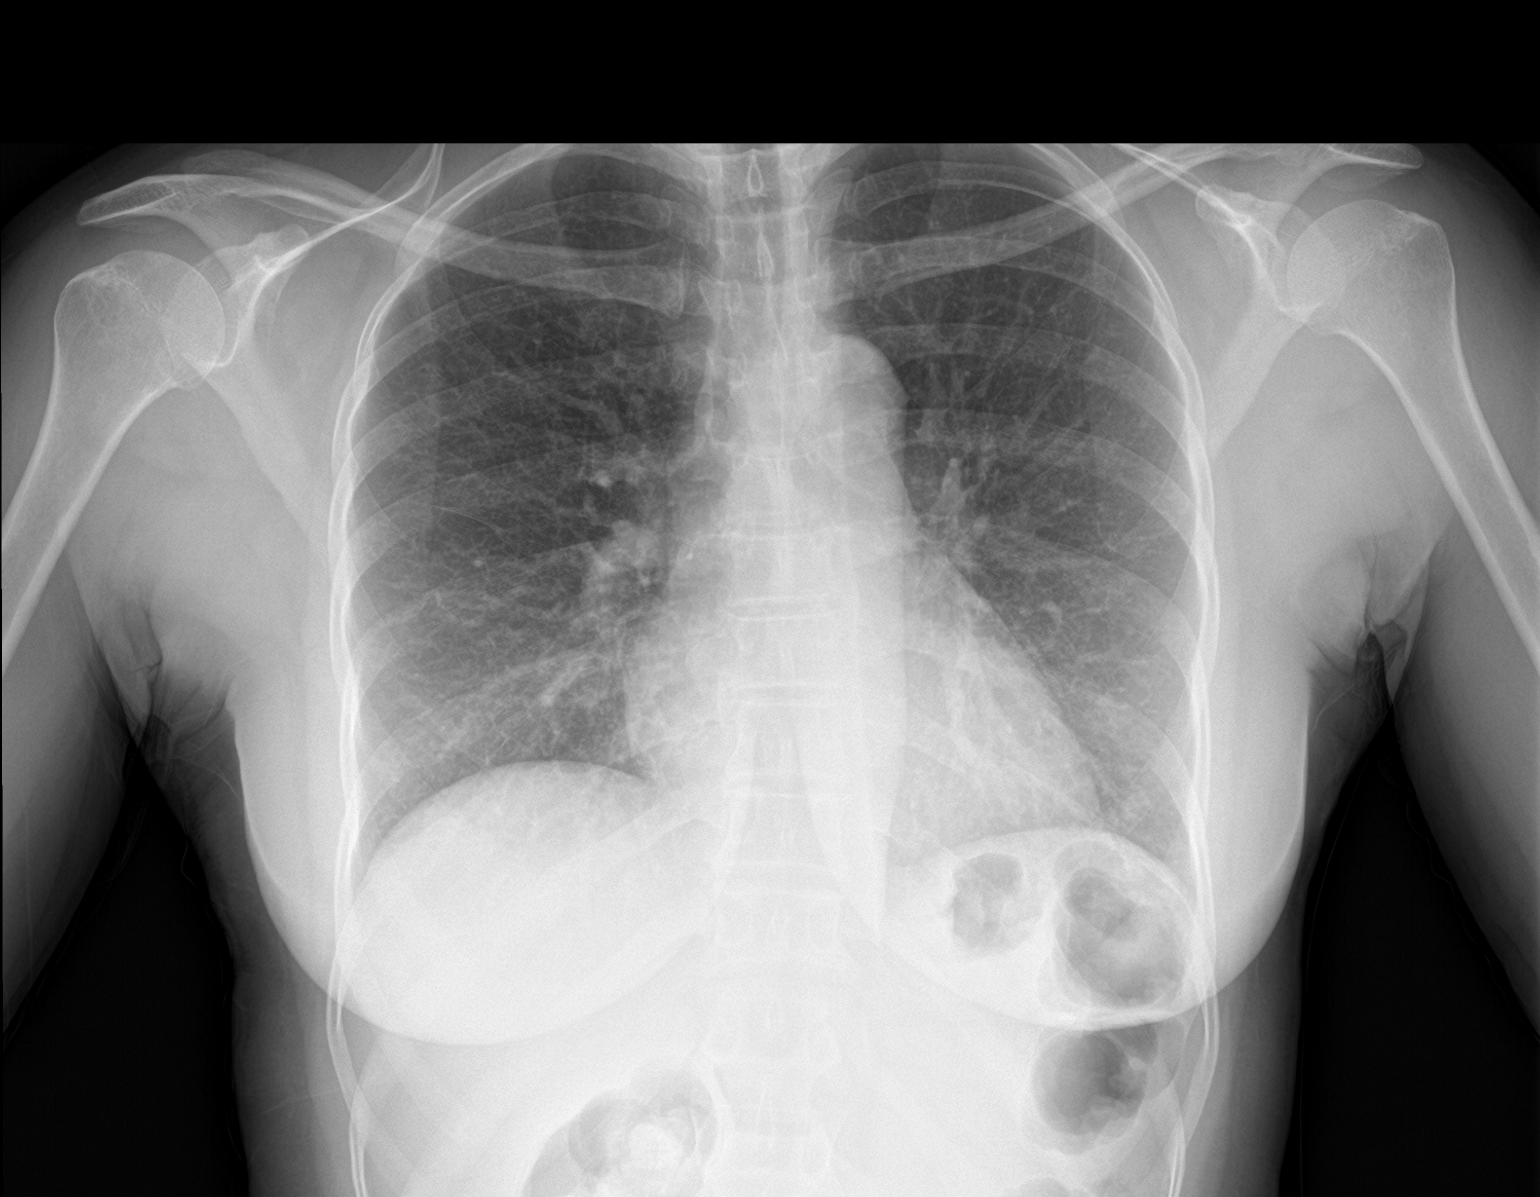

[abdomen erect]
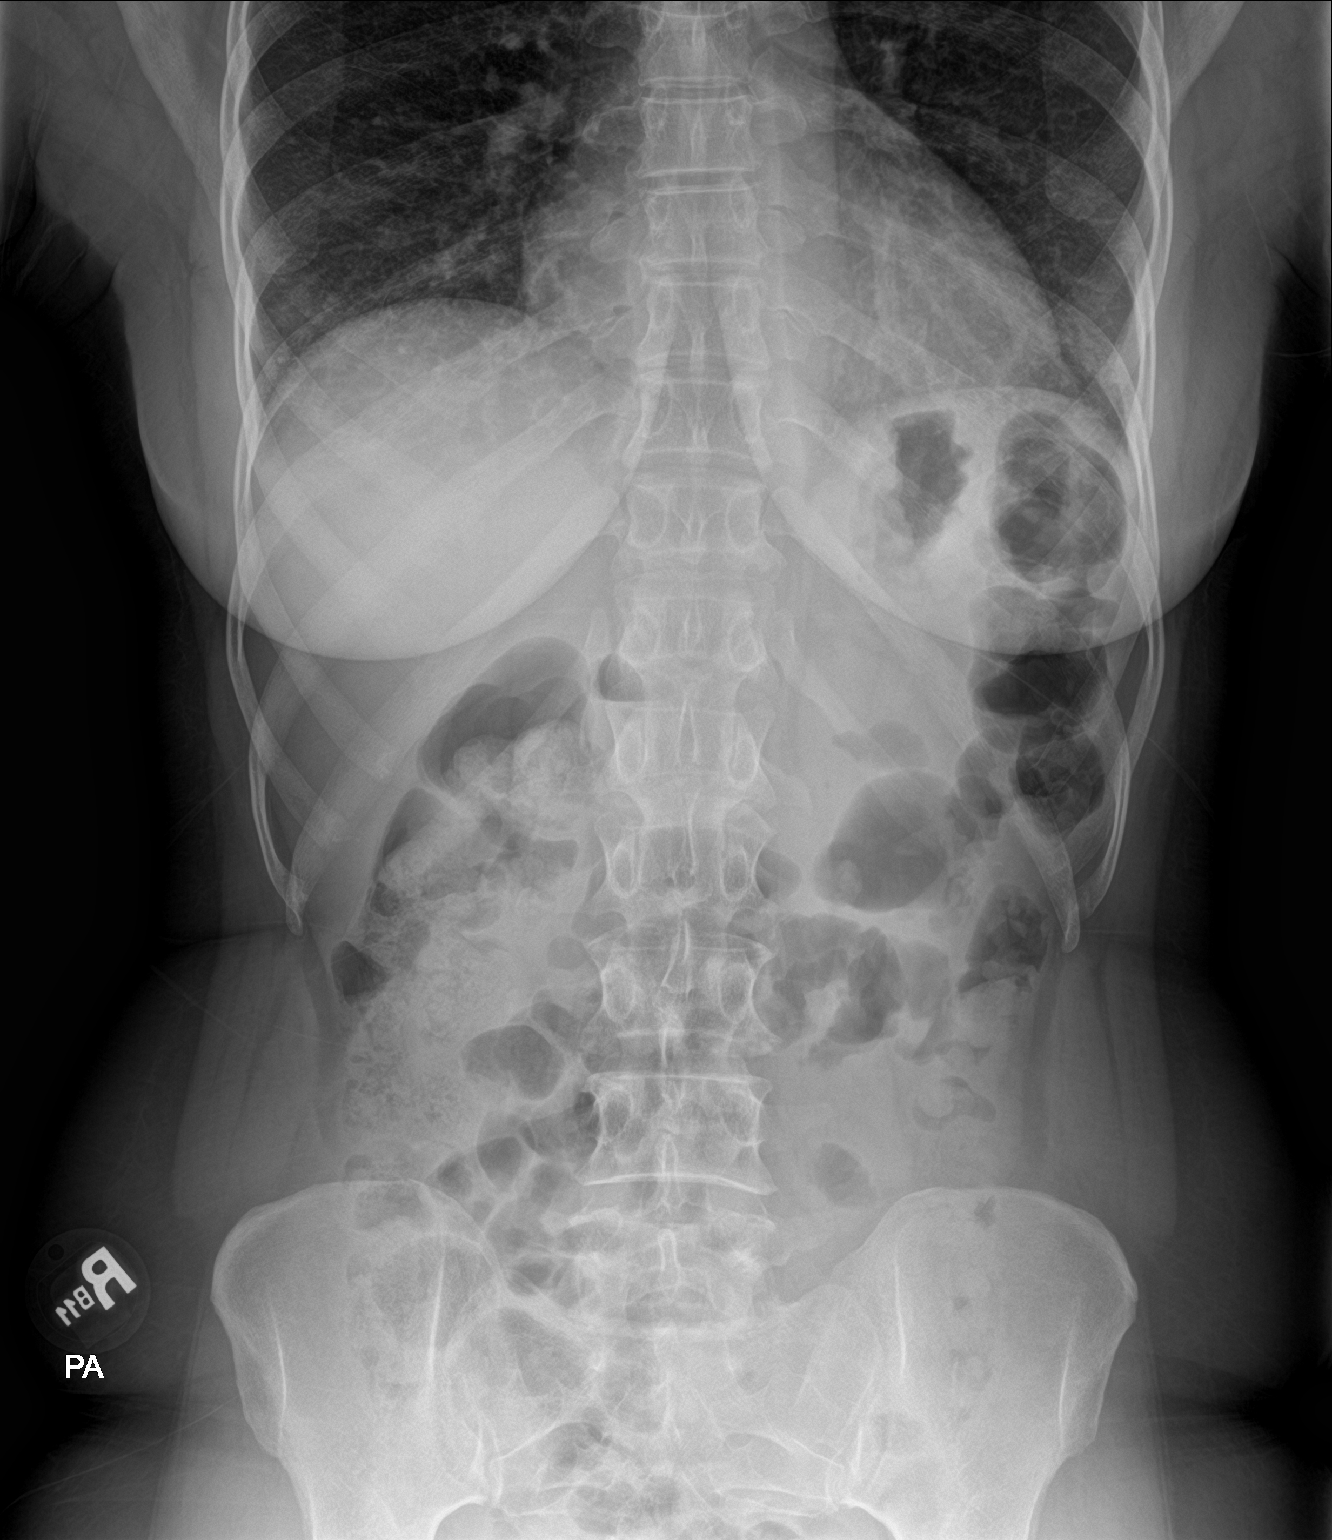

[abdomen supine]
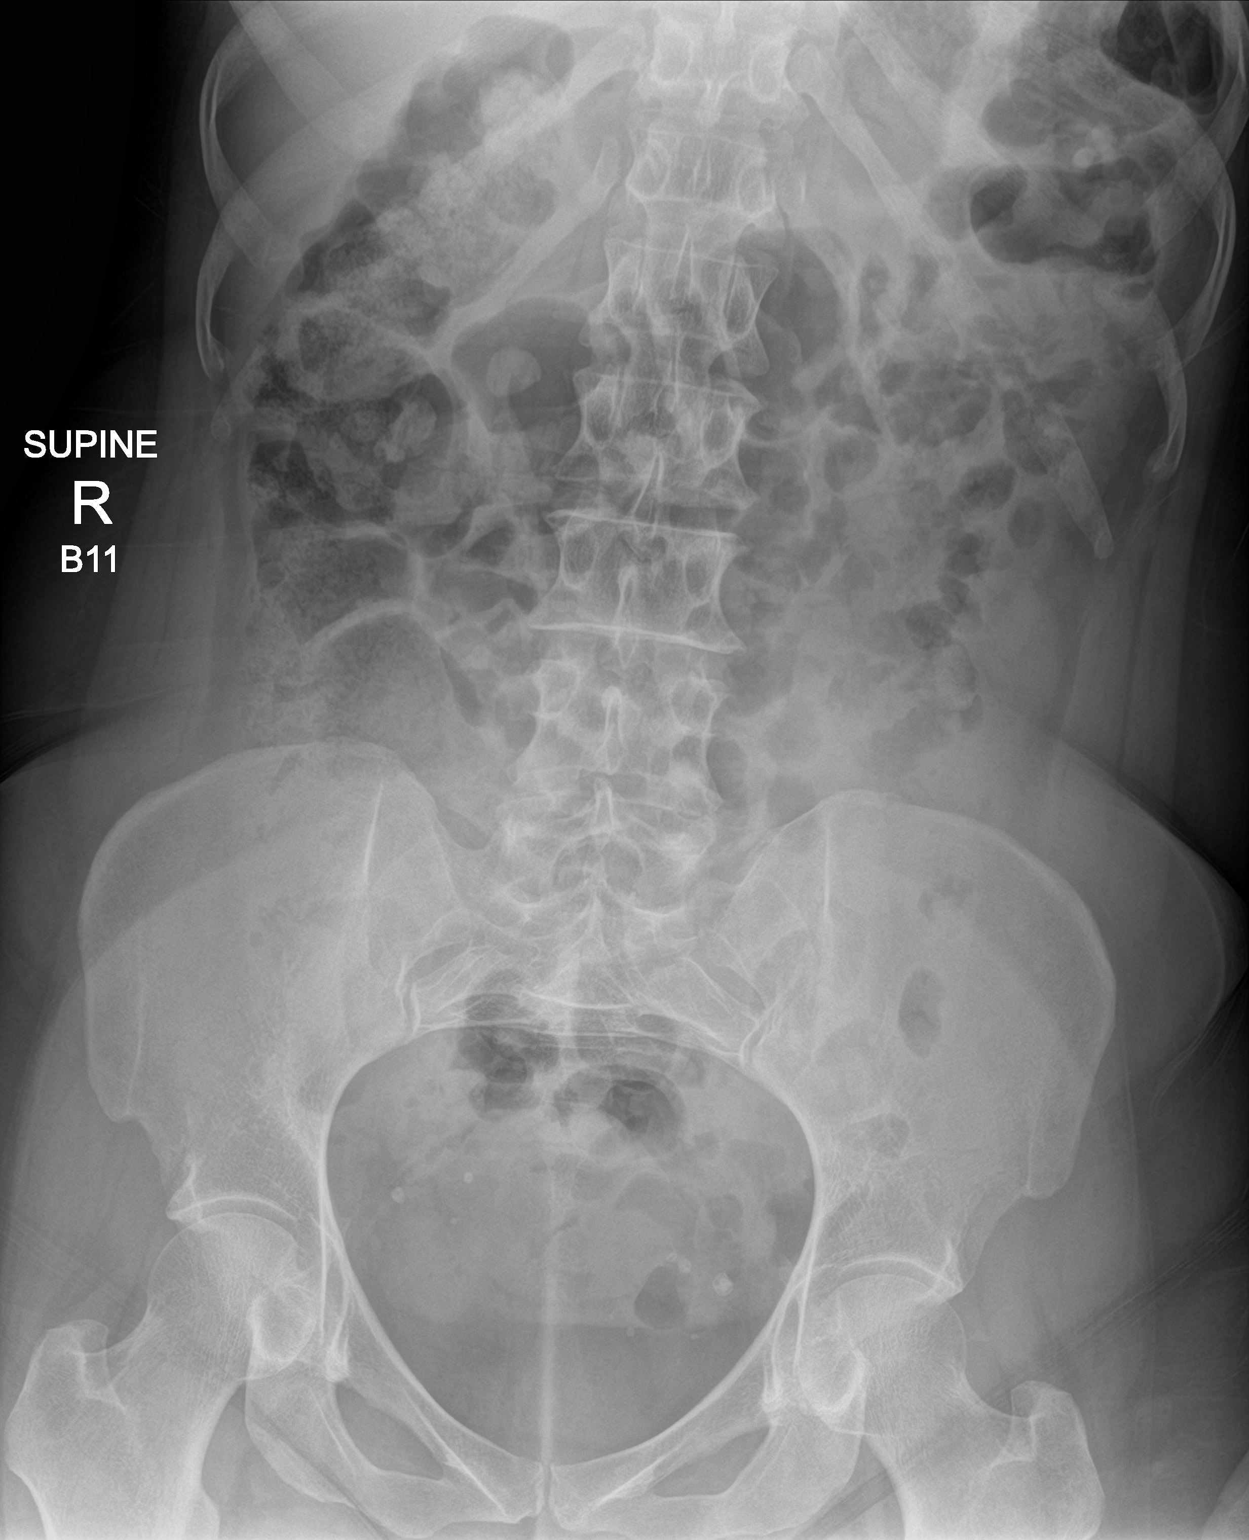

[3 of 3 positions shown; findings below may reference images not displayed]

FINDINGS: Normal heart size. Lungs clear. No pneumothorax. No pleural
effusion.

There is no free intraperitoneal gas. Generalized distention of both
small and large bowel is present across the abdomen in an ileus
pattern. No pneumatosis. Round calcifications projecting over the
pelvis are likely phleboliths.
IMPRESSION: No active cardiopulmonary disease.

Ileus pattern in the abdomen.

No free intraperitoneal gas to suggest ruptured bowel.

## 2020-01-26 DIAGNOSIS — B351 Tinea unguium: Secondary | ICD-10-CM | POA: Diagnosis not present

## 2020-01-26 DIAGNOSIS — L84 Corns and callosities: Secondary | ICD-10-CM | POA: Diagnosis not present

## 2020-01-26 DIAGNOSIS — L851 Acquired keratosis [keratoderma] palmaris et plantaris: Secondary | ICD-10-CM | POA: Diagnosis not present

## 2020-01-26 DIAGNOSIS — B353 Tinea pedis: Secondary | ICD-10-CM | POA: Diagnosis not present

## 2020-06-01 DIAGNOSIS — L84 Corns and callosities: Secondary | ICD-10-CM | POA: Diagnosis not present

## 2020-06-01 DIAGNOSIS — M21619 Bunion of unspecified foot: Secondary | ICD-10-CM | POA: Diagnosis not present

## 2020-06-01 DIAGNOSIS — F419 Anxiety disorder, unspecified: Secondary | ICD-10-CM | POA: Diagnosis not present

## 2020-06-01 DIAGNOSIS — F329 Major depressive disorder, single episode, unspecified: Secondary | ICD-10-CM | POA: Diagnosis not present

## 2020-06-28 DIAGNOSIS — B353 Tinea pedis: Secondary | ICD-10-CM | POA: Diagnosis not present

## 2020-06-28 DIAGNOSIS — L84 Corns and callosities: Secondary | ICD-10-CM | POA: Diagnosis not present

## 2020-06-28 DIAGNOSIS — L851 Acquired keratosis [keratoderma] palmaris et plantaris: Secondary | ICD-10-CM | POA: Diagnosis not present

## 2020-06-28 DIAGNOSIS — B351 Tinea unguium: Secondary | ICD-10-CM | POA: Diagnosis not present

## 2020-07-06 DIAGNOSIS — F419 Anxiety disorder, unspecified: Secondary | ICD-10-CM | POA: Diagnosis not present

## 2020-07-06 DIAGNOSIS — F329 Major depressive disorder, single episode, unspecified: Secondary | ICD-10-CM | POA: Diagnosis not present

## 2020-09-01 DIAGNOSIS — M21621 Bunionette of right foot: Secondary | ICD-10-CM | POA: Diagnosis not present

## 2020-09-01 DIAGNOSIS — M21622 Bunionette of left foot: Secondary | ICD-10-CM | POA: Diagnosis not present

## 2020-09-14 ENCOUNTER — Other Ambulatory Visit: Payer: Self-pay

## 2020-09-14 ENCOUNTER — Ambulatory Visit (INDEPENDENT_AMBULATORY_CARE_PROVIDER_SITE_OTHER): Payer: Federal, State, Local not specified - PPO

## 2020-09-14 ENCOUNTER — Ambulatory Visit (INDEPENDENT_AMBULATORY_CARE_PROVIDER_SITE_OTHER): Payer: Federal, State, Local not specified - PPO | Admitting: Podiatry

## 2020-09-14 DIAGNOSIS — M79672 Pain in left foot: Secondary | ICD-10-CM

## 2020-09-14 DIAGNOSIS — M79675 Pain in left toe(s): Secondary | ICD-10-CM

## 2020-09-14 DIAGNOSIS — M79671 Pain in right foot: Secondary | ICD-10-CM

## 2020-09-14 DIAGNOSIS — M79674 Pain in right toe(s): Secondary | ICD-10-CM

## 2020-09-14 DIAGNOSIS — L989 Disorder of the skin and subcutaneous tissue, unspecified: Secondary | ICD-10-CM

## 2020-09-14 DIAGNOSIS — B351 Tinea unguium: Secondary | ICD-10-CM | POA: Diagnosis not present

## 2020-09-14 MED ORDER — ITRACONAZOLE 100 MG PO CAPS: 100.0000 mg | ORAL_CAPSULE | Freq: Every day | ORAL | 0 refills | Status: AC

## 2020-09-14 NOTE — Progress Notes (Signed)
   Subjective: 45 y.o. female presenting to the office today presenting as a new patient referral from Dr. Elijah Birk, local podiatrist for evaluation of foot pain bilateral.  Patient states that she was diagnosed with bone spurs.  She presents for further treatment and evaluation   Past Medical History:  Diagnosis Date   Anxiety    Depression      Objective:  Physical Exam General: Alert and oriented x3 in no acute distress  Dermatology: Hyperkeratotic lesion(s) present on the bilateral great toes as well as the weightbearing surfaces of the bilateral feet. Pain on palpation with a central nucleated core noted. Skin is warm, dry and supple bilateral lower extremities. Negative for open lesions or macerations.  Hyperkeratotic elongated thickened dystrophic nails noted 1-5 bilateral  Vascular: Palpable pedal pulses bilaterally. No edema or erythema noted. Capillary refill within normal limits.  Neurological: Epicritic and protective threshold grossly intact bilaterally.   Musculoskeletal Exam: Pain on palpation at the keratotic lesion(s) noted. Range of motion within normal limits bilateral. Muscle strength 5/5 in all groups bilateral.  Assessment: 1.  Porokeratoses/preulcerative callus lesions bilateral feet 2.  Onychomycosis of toenails 1-5 bilateral   Plan of Care:  1. Patient evaluated 2. Excisional debridement of keratoic lesion(s) using a chisel blade was performed without incident.  3.  Recommend OTC corn and callus remover daily 4.  Today we discussed different treatment options including oral, topical, and laser antifungal treatment modalities regarding the onychomycosis of the toenails.  The patient has taken oral Lamisil in the past however she was unable to take it because of the side effects.  It gave her metallic taste in her mouth  5.  Today we will prescribe Sporanox to see if she has better toleration of this prescription over the Lamisil.  Patient denies a history of  liver pathology or symptoms.  She states that she is otherwise healthy 6.  Patient is to return to the clinic 4 weeks  Felecia Shelling, DPM [facility]  Dr. Felecia Shelling, DPM    [address] [place], [place] [address]                Office [phone]  Fax [phone]

## 2020-10-06 DIAGNOSIS — R5383 Other fatigue: Secondary | ICD-10-CM | POA: Diagnosis not present

## 2020-10-06 DIAGNOSIS — R519 Headache, unspecified: Secondary | ICD-10-CM | POA: Diagnosis not present

## 2020-10-06 DIAGNOSIS — F329 Major depressive disorder, single episode, unspecified: Secondary | ICD-10-CM | POA: Diagnosis not present

## 2020-10-06 DIAGNOSIS — F419 Anxiety disorder, unspecified: Secondary | ICD-10-CM | POA: Diagnosis not present

## 2020-10-12 ENCOUNTER — Ambulatory Visit: Payer: Federal, State, Local not specified - PPO | Admitting: Podiatry

## 2021-10-25 DIAGNOSIS — R059 Cough, unspecified: Secondary | ICD-10-CM | POA: Diagnosis not present

## 2021-10-25 DIAGNOSIS — Z03818 Encounter for observation for suspected exposure to other biological agents ruled out: Secondary | ICD-10-CM | POA: Diagnosis not present

## 2021-10-25 DIAGNOSIS — R52 Pain, unspecified: Secondary | ICD-10-CM | POA: Diagnosis not present

## 2021-10-25 DIAGNOSIS — R0981 Nasal congestion: Secondary | ICD-10-CM | POA: Diagnosis not present

## 2021-10-25 DIAGNOSIS — J029 Acute pharyngitis, unspecified: Secondary | ICD-10-CM | POA: Diagnosis not present

## 2021-10-26 DIAGNOSIS — M5412 Radiculopathy, cervical region: Secondary | ICD-10-CM | POA: Diagnosis not present

## 2021-10-26 DIAGNOSIS — J069 Acute upper respiratory infection, unspecified: Secondary | ICD-10-CM | POA: Diagnosis not present

## 2022-01-25 DIAGNOSIS — Z13220 Encounter for screening for lipoid disorders: Secondary | ICD-10-CM | POA: Diagnosis not present

## 2022-01-25 DIAGNOSIS — G479 Sleep disorder, unspecified: Secondary | ICD-10-CM | POA: Diagnosis not present

## 2022-01-25 DIAGNOSIS — Z0000 Encounter for general adult medical examination without abnormal findings: Secondary | ICD-10-CM | POA: Diagnosis not present

## 2022-01-25 DIAGNOSIS — F329 Major depressive disorder, single episode, unspecified: Secondary | ICD-10-CM | POA: Diagnosis not present

## 2022-02-08 DIAGNOSIS — L821 Other seborrheic keratosis: Secondary | ICD-10-CM | POA: Diagnosis not present

## 2022-02-08 DIAGNOSIS — L905 Scar conditions and fibrosis of skin: Secondary | ICD-10-CM | POA: Diagnosis not present

## 2022-03-27 DIAGNOSIS — Z20822 Contact with and (suspected) exposure to covid-19: Secondary | ICD-10-CM | POA: Diagnosis not present

## 2022-03-27 DIAGNOSIS — R109 Unspecified abdominal pain: Secondary | ICD-10-CM | POA: Diagnosis not present

## 2022-03-27 DIAGNOSIS — R1110 Vomiting, unspecified: Secondary | ICD-10-CM | POA: Diagnosis not present

## 2022-03-27 DIAGNOSIS — K5909 Other constipation: Secondary | ICD-10-CM | POA: Diagnosis not present

## 2022-05-09 ENCOUNTER — Other Ambulatory Visit: Payer: Self-pay

## 2022-05-09 DIAGNOSIS — R1011 Right upper quadrant pain: Secondary | ICD-10-CM | POA: Diagnosis not present

## 2022-05-09 DIAGNOSIS — Z1231 Encounter for screening mammogram for malignant neoplasm of breast: Secondary | ICD-10-CM

## 2022-06-25 ENCOUNTER — Inpatient Hospital Stay: Admission: RE | Admit: 2022-06-25 | Payer: Federal, State, Local not specified - PPO | Source: Ambulatory Visit

## 2022-10-01 DIAGNOSIS — R112 Nausea with vomiting, unspecified: Secondary | ICD-10-CM | POA: Diagnosis not present

## 2022-10-01 DIAGNOSIS — R5381 Other malaise: Secondary | ICD-10-CM | POA: Diagnosis not present

## 2022-10-01 DIAGNOSIS — R1031 Right lower quadrant pain: Secondary | ICD-10-CM | POA: Diagnosis not present

## 2022-10-17 DIAGNOSIS — R051 Acute cough: Secondary | ICD-10-CM | POA: Diagnosis not present

## 2022-10-17 DIAGNOSIS — U071 COVID-19: Secondary | ICD-10-CM | POA: Diagnosis not present

## 2023-01-03 DIAGNOSIS — L304 Erythema intertrigo: Secondary | ICD-10-CM | POA: Diagnosis not present

## 2023-01-18 ENCOUNTER — Ambulatory Visit: Payer: Federal, State, Local not specified - PPO | Admitting: Podiatry
